# Patient Record
Sex: Male | Born: 1970 | Race: White | Hispanic: No | Smoking: Current every day smoker
Health system: Southern US, Community
[De-identification: ages and names within clinical notes are randomized; demographics above are authoritative.]

## PROBLEM LIST (undated history)

## (undated) DIAGNOSIS — E785 Hyperlipidemia, unspecified: Secondary | ICD-10-CM

## (undated) DIAGNOSIS — F4310 Post-traumatic stress disorder, unspecified: Secondary | ICD-10-CM

## (undated) DIAGNOSIS — W3400XA Accidental discharge from unspecified firearms or gun, initial encounter: Secondary | ICD-10-CM

## (undated) DIAGNOSIS — K8020 Calculus of gallbladder without cholecystitis without obstruction: Secondary | ICD-10-CM

## (undated) DIAGNOSIS — F319 Bipolar disorder, unspecified: Secondary | ICD-10-CM

## (undated) DIAGNOSIS — S71139A Puncture wound without foreign body, unspecified thigh, initial encounter: Secondary | ICD-10-CM

## (undated) HISTORY — PX: FRACTURE SURGERY: SHX138

---

## 2008-03-12 ENCOUNTER — Emergency Department: Payer: Self-pay | Admitting: Emergency Medicine

## 2008-04-27 ENCOUNTER — Inpatient Hospital Stay: Payer: Self-pay | Admitting: Unknown Physician Specialty

## 2008-06-19 ENCOUNTER — Emergency Department: Payer: Self-pay | Admitting: Internal Medicine

## 2008-06-20 ENCOUNTER — Emergency Department: Payer: Self-pay | Admitting: Emergency Medicine

## 2009-01-01 ENCOUNTER — Inpatient Hospital Stay: Payer: Self-pay | Admitting: Psychiatry

## 2009-01-12 ENCOUNTER — Inpatient Hospital Stay: Payer: Self-pay | Admitting: Psychiatry

## 2009-01-29 ENCOUNTER — Emergency Department: Payer: Self-pay | Admitting: Emergency Medicine

## 2009-03-29 ENCOUNTER — Emergency Department: Payer: Self-pay | Admitting: Emergency Medicine

## 2009-05-04 ENCOUNTER — Emergency Department: Payer: Self-pay | Admitting: Emergency Medicine

## 2009-06-14 ENCOUNTER — Emergency Department: Payer: Self-pay | Admitting: Unknown Physician Specialty

## 2009-08-12 ENCOUNTER — Emergency Department: Payer: Self-pay

## 2010-02-05 IMAGING — CR NASAL BONES - 3+ VIEW
1 series · 3 of 3 positions shown · non-contrast
Comparison: none

REASON FOR EXAM: trauma
COMMENTS:

PROCEDURE:     DXR - DXR NASAL BONES  - January 29, 2009  [DATE]
RESULT:     There is a minimally displaced, comminuted fracture of the nasal
bridge. The paranasal sinuses are clear.

[Series 1: view not recorded · 0.17mm/px · 3 of 3 slices shown]
[im 1/3]
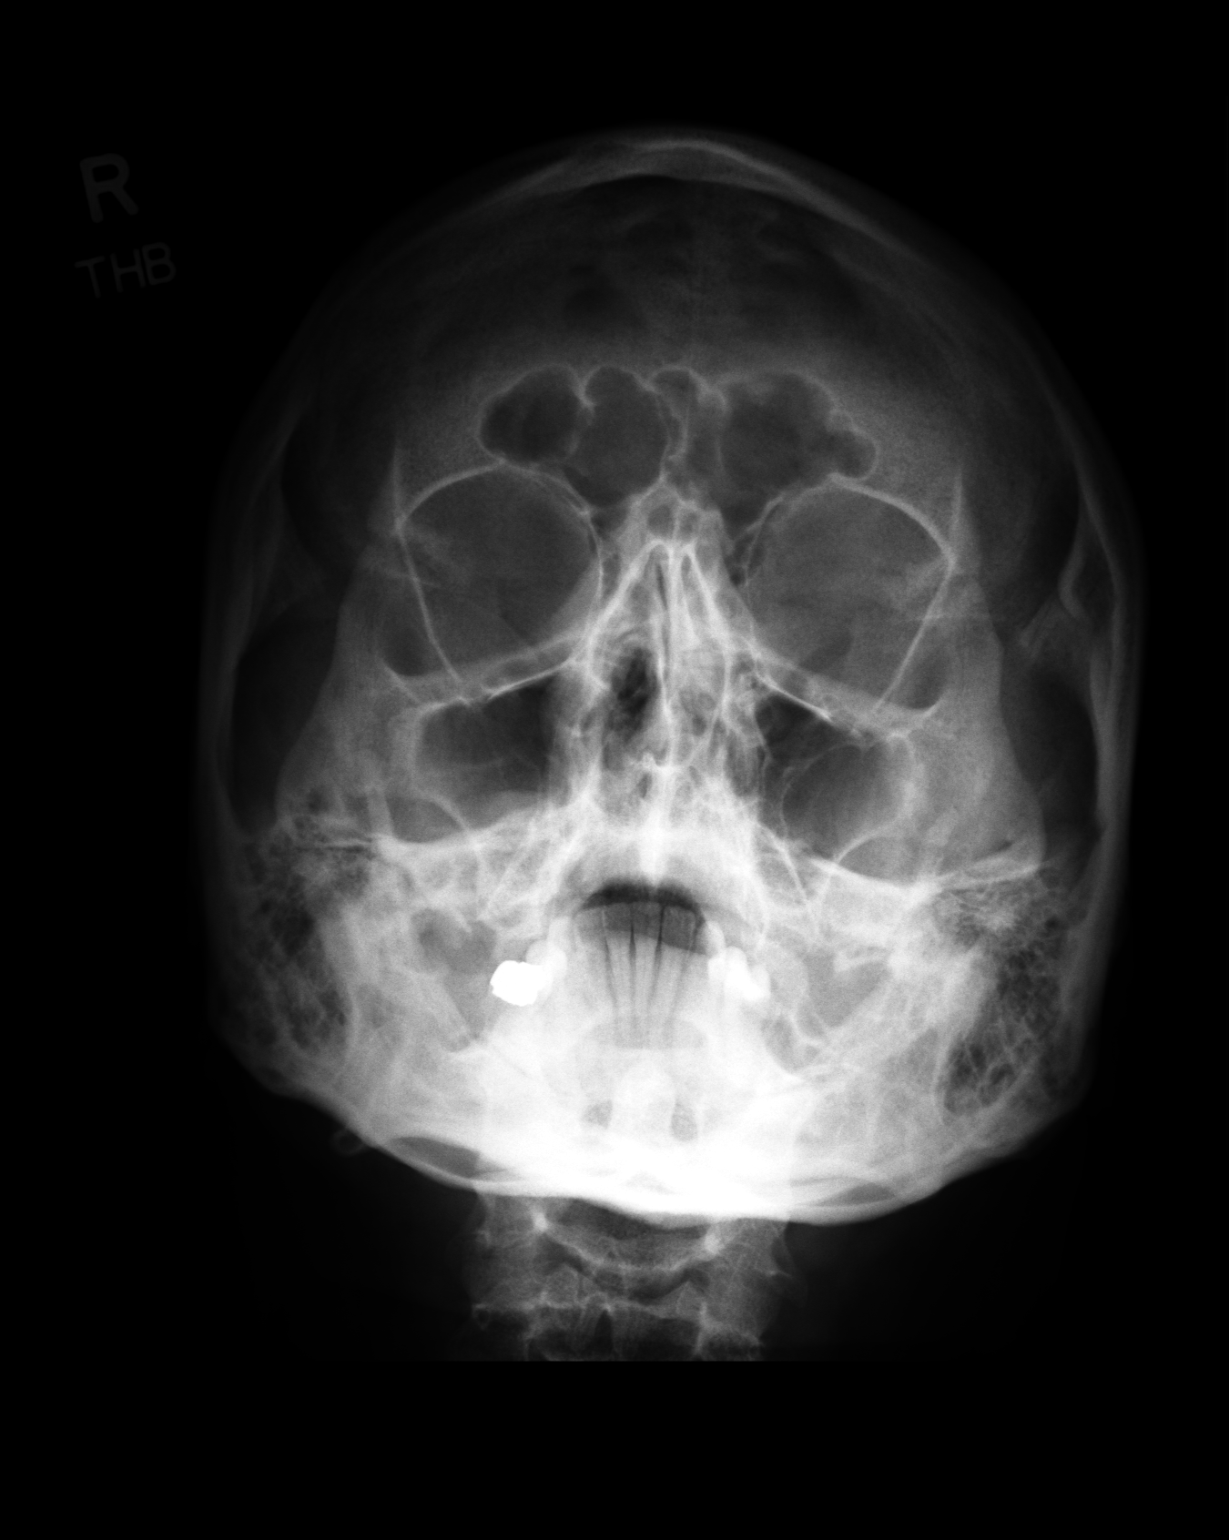
[im 2/3]
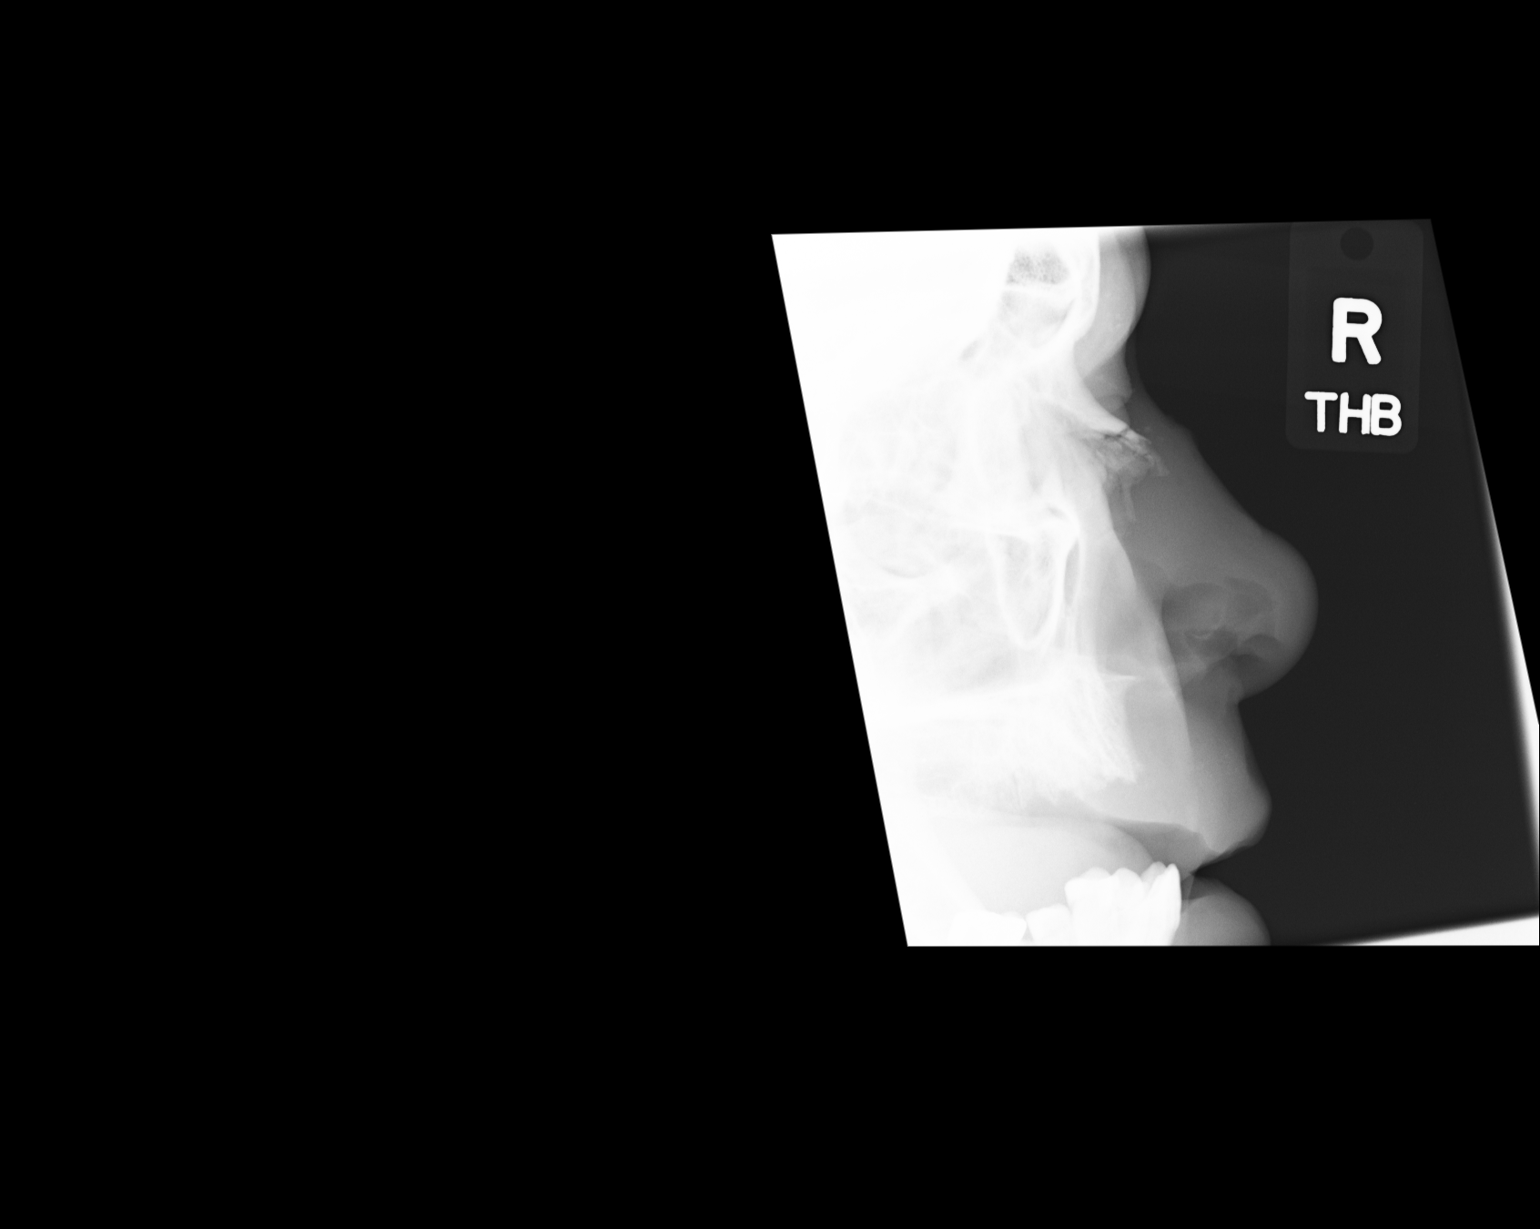
[im 3/3]
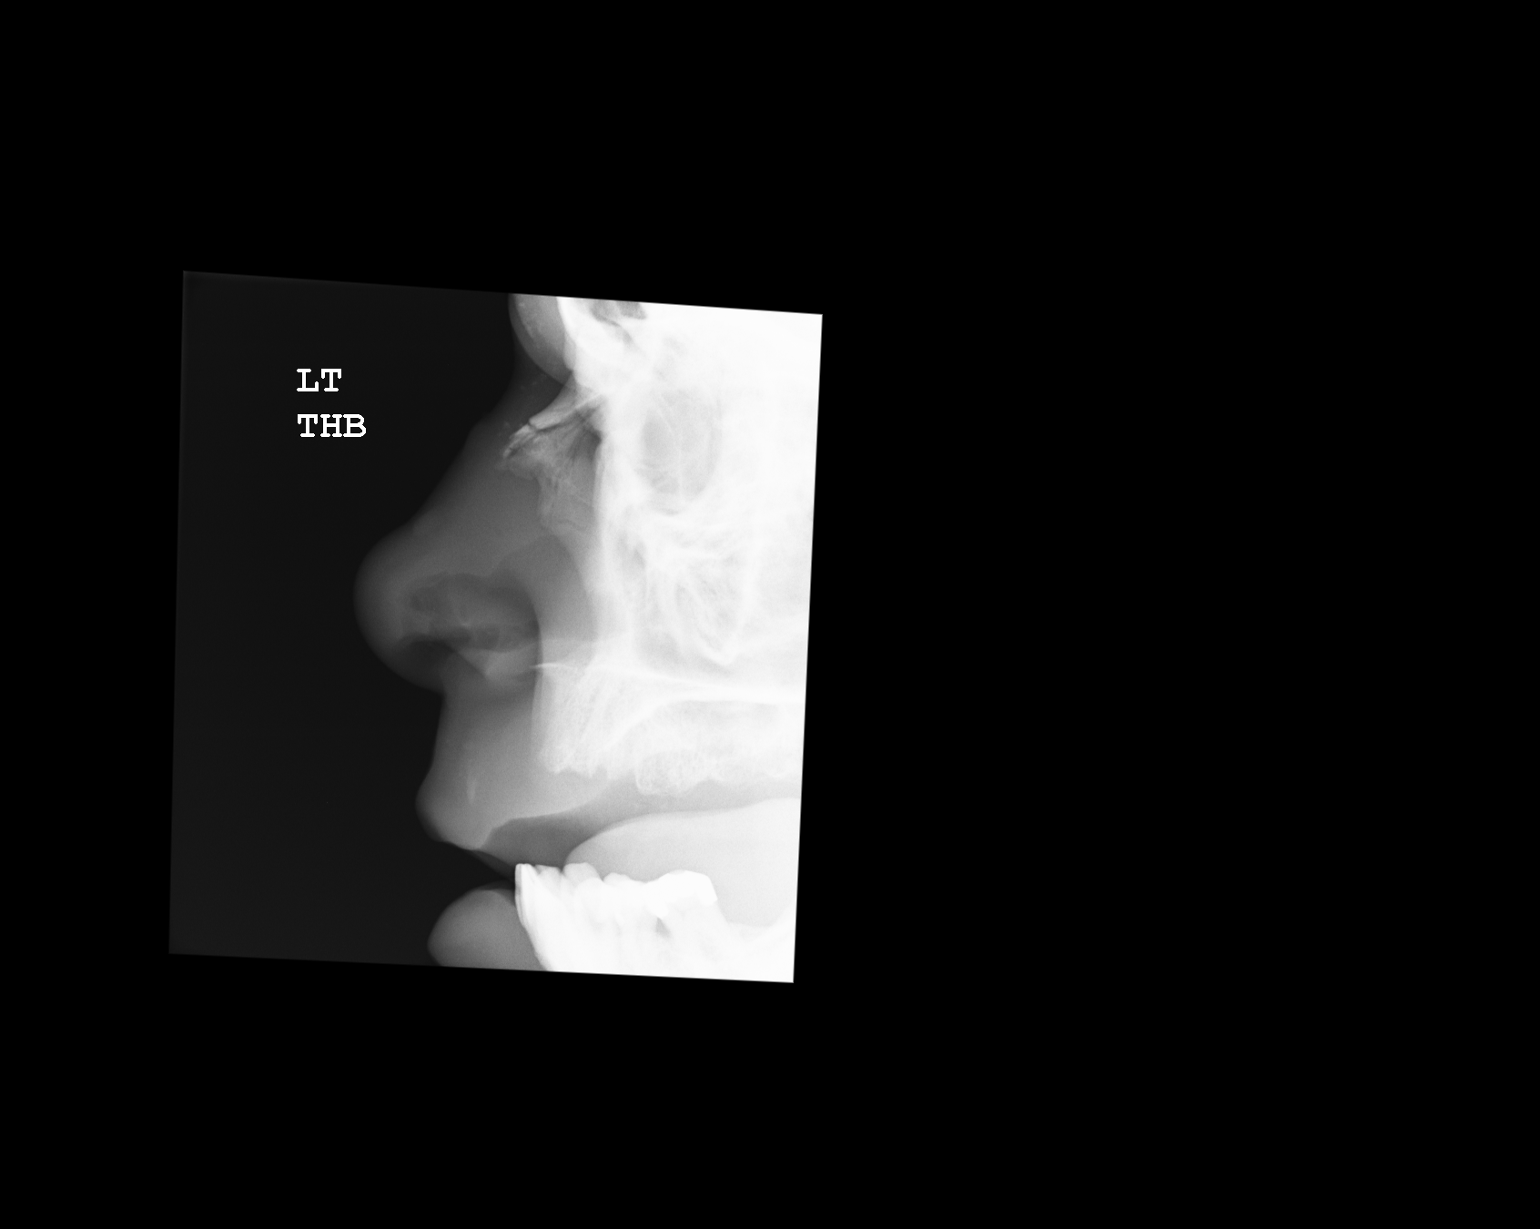

[3 of 3 positions shown; findings below may reference images not displayed]

IMPRESSION: 1. Fracture of the nasal bridge, minimally displaced.
2. The paranasal sinuses are clear.

## 2010-06-15 ENCOUNTER — Emergency Department: Payer: Self-pay | Admitting: Emergency Medicine

## 2010-06-23 ENCOUNTER — Emergency Department: Payer: Self-pay | Admitting: Emergency Medicine

## 2010-10-22 ENCOUNTER — Emergency Department (HOSPITAL_COMMUNITY)
Admission: EM | Admit: 2010-10-22 | Discharge: 2010-10-23 | Payer: Self-pay | Source: Home / Self Care | Admitting: Emergency Medicine

## 2010-12-20 ENCOUNTER — Emergency Department: Payer: Self-pay | Admitting: Emergency Medicine

## 2011-01-02 LAB — RAPID URINE DRUG SCREEN, HOSP PERFORMED
Amphetamines: POSITIVE — AB
Barbiturates: NOT DETECTED
Benzodiazepines: NOT DETECTED
Cocaine: NOT DETECTED
Opiates: NOT DETECTED

## 2011-01-02 LAB — CBC
MCH: 31.4 pg (ref 26.0–34.0)
MCHC: 36.9 g/dL — ABNORMAL HIGH (ref 30.0–36.0)
MCV: 85.2 fL (ref 78.0–100.0)
Platelets: 203 10*3/uL (ref 150–400)
RBC: 5.6 MIL/uL (ref 4.22–5.81)

## 2011-01-02 LAB — URINALYSIS, ROUTINE W REFLEX MICROSCOPIC
Glucose, UA: NEGATIVE mg/dL
Hgb urine dipstick: NEGATIVE
Specific Gravity, Urine: 1.025 (ref 1.005–1.030)
pH: 6.5 (ref 5.0–8.0)

## 2011-01-02 LAB — BASIC METABOLIC PANEL
CO2: 25 mEq/L (ref 19–32)
Calcium: 9.1 mg/dL (ref 8.4–10.5)
Chloride: 103 mEq/L (ref 96–112)
Creatinine, Ser: 0.78 mg/dL (ref 0.4–1.5)
Glucose, Bld: 94 mg/dL (ref 70–99)

## 2011-01-02 LAB — DIFFERENTIAL
Basophils Relative: 0 % (ref 0–1)
Eosinophils Absolute: 0.4 10*3/uL (ref 0.0–0.7)
Eosinophils Relative: 3 % (ref 0–5)
Lymphs Abs: 2.5 10*3/uL (ref 0.7–4.0)
Monocytes Absolute: 0.8 10*3/uL (ref 0.1–1.0)
Neutrophils Relative %: 69 % (ref 43–77)

## 2011-01-02 LAB — VALPROIC ACID LEVEL: Valproic Acid Lvl: 66 ug/mL (ref 50.0–100.0)

## 2015-04-14 ENCOUNTER — Emergency Department
Admission: EM | Admit: 2015-04-14 | Discharge: 2015-04-15 | Disposition: A | Discharge status: Other Acute Inpatient Hospital | Payer: Self-pay | Attending: Emergency Medicine | Admitting: Emergency Medicine

## 2015-04-14 DIAGNOSIS — F209 Schizophrenia, unspecified: Secondary | ICD-10-CM | POA: Insufficient documentation

## 2015-04-14 DIAGNOSIS — Z720 Tobacco use: Secondary | ICD-10-CM | POA: Insufficient documentation

## 2015-04-14 DIAGNOSIS — F1410 Cocaine abuse, uncomplicated: Secondary | ICD-10-CM

## 2015-04-14 DIAGNOSIS — Z880 Allergy status to penicillin: Secondary | ICD-10-CM | POA: Insufficient documentation

## 2015-04-14 DIAGNOSIS — F319 Bipolar disorder, unspecified: Secondary | ICD-10-CM

## 2015-04-14 DIAGNOSIS — R440 Auditory hallucinations: Secondary | ICD-10-CM

## 2015-04-14 HISTORY — DX: Hyperlipidemia, unspecified: E78.5

## 2015-04-14 HISTORY — DX: Calculus of gallbladder without cholecystitis without obstruction: K80.20

## 2015-04-14 LAB — CBC
HEMATOCRIT: 48.1 % (ref 40.0–52.0)
Hemoglobin: 16.4 g/dL (ref 13.0–18.0)
MCH: 29.1 pg (ref 26.0–34.0)
MCHC: 34 g/dL (ref 32.0–36.0)
MCV: 85.7 fL (ref 80.0–100.0)
PLATELETS: 273 10*3/uL (ref 150–440)
RBC: 5.61 MIL/uL (ref 4.40–5.90)
RDW: 12.7 % (ref 11.5–14.5)
WBC: 12.2 10*3/uL — AB (ref 3.8–10.6)

## 2015-04-14 LAB — COMPREHENSIVE METABOLIC PANEL
ALK PHOS: 57 U/L (ref 38–126)
ALT: 45 U/L (ref 17–63)
AST: 38 U/L (ref 15–41)
Albumin: 5 g/dL (ref 3.5–5.0)
Anion gap: 11 (ref 5–15)
BUN: 13 mg/dL (ref 6–20)
CALCIUM: 9 mg/dL (ref 8.9–10.3)
CO2: 24 mmol/L (ref 22–32)
Chloride: 102 mmol/L (ref 101–111)
Creatinine, Ser: 0.91 mg/dL (ref 0.61–1.24)
GFR calc Af Amer: >60 mL/min (ref >60)
GFR calc non Af Amer: >60 mL/min (ref >60)
Glucose, Bld: 143 mg/dL — ABNORMAL HIGH (ref 65–99)
POTASSIUM: 3.6 mmol/L (ref 3.5–5.1)
Sodium: 137 mmol/L (ref 135–145)
TOTAL PROTEIN: 8.3 g/dL — AB (ref 6.5–8.1)
Total Bilirubin: 0.7 mg/dL (ref 0.3–1.2)

## 2015-04-14 LAB — SALICYLATE LEVEL

## 2015-04-14 LAB — ACETAMINOPHEN LEVEL

## 2015-04-14 LAB — ETHANOL

## 2015-04-14 MED ORDER — ZIPRASIDONE HCL 20 MG PO CAPS
40.0000 mg | ORAL_CAPSULE | Freq: Two times a day (BID) | ORAL | Status: DC
  Administered 2015-04-15: 40 mg via ORAL
  Filled 2015-04-14: qty 2

## 2015-04-14 NOTE — ED Notes (Signed)

## 2015-04-14 NOTE — ED Notes (Signed)
ENVIRONMENTAL ASSESSMENT Potentially harmful objects out of patient reach: Yes.   Personal belongings secured: Yes.   Patient dressed in hospital provided attire only: Yes.   Plastic bags out of patient reach: Yes.   Patient care equipment (cords, cables, call bells, lines, and drains) shortened, removed, or accounted for: Yes.   Equipment and supplies removed from bottom of stretcher: N/A Potentially toxic materials out of patient reach: Yes.   Sharps container removed or out of patient reach: N/A 

## 2015-04-14 NOTE — ED Notes (Signed)
Are from [facility].  Pt sleeping in bed.

## 2015-04-14 NOTE — ED Notes (Signed)

## 2015-04-14 NOTE — ED Notes (Signed)
Pt eating lunch at this time

## 2015-04-14 NOTE — ED Notes (Signed)
Report received from [facility] RN pt moved to ED BHU 3. Patient care assumed. Patient/RN introduction complete. Will continue to monitor.

## 2015-04-14 NOTE — ED Notes (Addendum)
BEHAVIORAL HEALTH ROUNDING Patient sleeping: Yes Patient alert and oriented: yes Behavior appropriate: Yes.  ; Nutrition and fluids offered: Yes  Toileting and hygiene offered: Yes  Sitter present: not applicable Law enforcement present: Yes

## 2015-04-14 NOTE — ED Notes (Signed)
Pt. transfered to BHU without incident after report from. Placed in room and oriented to unit. Pt. informed that for their safety all care areas are designed for safety and monitored by security cameras at all times; and visiting hours explained to patient. Patient verbalizes understanding, and verbal contract for safety obtained.   

## 2015-04-14 NOTE — ED Notes (Signed)
BEHAVIORAL HEALTH ROUNDING Patient sleeping: No. Patient alert and oriented: yes Behavior appropriate: Yes.  ; If no, describe:  Nutrition and fluids offered: Yes  Toileting and hygiene offered: Yes  Sitter present: yes Law enforcement present: Yes  

## 2015-04-14 NOTE — ED Notes (Signed)
BEHAVIORAL HEALTH ROUNDING Patient sleeping: no Patient alert and oriented: yes Behavior appropriate: Yes.  ; Nutrition and fluids offered: Yes  Toileting and hygiene offered: Yes  Sitter present: not applicable Law enforcement present: Yes   

## 2015-04-14 NOTE — ED Notes (Signed)
ED BHU PLACEMENT JUSTIFICATION Is the patient under IVC or is there intent for IVC: No. Is the patient medically cleared: Yes.   Is there vacancy in the ED BHU: Yes.   Is the population mix appropriate for patient: Yes.   Is the patient awaiting placement in inpatient or outpatient setting: Yes.   Has the patient had a psychiatric consult: No. Survey of unit performed for contraband, proper placement and condition of furniture, tampering with fixtures in bathroom, shower, and each patient room: Yes.  ; Findings:  APPEARANCE/BEHAVIOR calm, cooperative and adequate rapport can be established NEURO ASSESSMENT Orientation: time, place and person Hallucinations: Yes.  Auditory Hallucinations Speech: Normal Gait: normal RESPIRATORY ASSESSMENT Normal expansion.  Clear to auscultation.  No rales, rhonchi, or wheezing. CARDIOVASCULAR ASSESSMENT regular rate and rhythm, S1, S2 normal, no murmur, click, rub or gallop GASTROINTESTINAL ASSESSMENT soft, nontender, BS WNL, no r/g EXTREMITIES normal strength, tone, and muscle mass PLAN OF CARE Provide calm/safe environment. Vital signs assessed twice daily. ED BHU Assessment once each 12-hour shift. Collaborate with intake RN daily or as condition indicates. Assure the ED provider has rounded once each shift. Provide and encourage hygiene. Provide redirection as needed. Assess for escalating behavior; address immediately and inform ED provider.  Assess family dynamic and appropriateness for visitation as needed: Yes.  ; If necessary, describe findings:  Educate the patient/family about BHU procedures/visitation: Yes.  ; If necessary, describe findings:

## 2015-04-14 NOTE — ED Notes (Signed)
Pt sleeping. 

## 2015-04-14 NOTE — ED Notes (Signed)
[place], MD at bedside at this time  

## 2015-04-14 NOTE — ED Notes (Signed)
Breakfast tray given. °

## 2015-04-14 NOTE — ED Notes (Signed)
Pt reports auditory hallucinations and SI. Pt states he normally takes Geodon and Cogentin, but has not been complaint or had access to those medications in over a year. Pt states he's here for help obtaining his medications, but then goes on the state, "I'm suicidal..I'm schizophrenic.Marland KitchenMarland KitchenI keep hearing voices telling me to kill myself". Pt denies any suicidal intention or concrete plan.

## 2015-04-14 NOTE — Consult Note (Signed)
[facility] Consult   Reason for Consult:  Consult for this 44 year old man with a history of bipolar disorder and substance abuse Referring Physician:  [place] Patient Identification: Wayne Santiago MRN:  [id number] Principal Diagnosis: Bipolar 1 disorder, depressed Diagnosis:   Patient Active Problem List   Diagnosis Date Noted  . Bipolar 1 disorder, depressed [F31.9] 04/14/2015  . Cocaine abuse [F14.10] 04/14/2015    Total Time spent with patient: 1 hour  Subjective:   [name] is a 44 y.o. male patient admitted with "I'm suicidal and hearing voices".  HPI:  Patient presented to the emergency room stating that he was hearing voices and was suicidal. He had trouble characterizing the voices saying that they were just voices. He could not characterize how long they have been going on. He says that he is suicidal but has no specific method or intent in mind. Says that he has been feeling suicidal for a few days. Mood is been down. Sleeping poorly. He's been off his medicine for an indefinite period of time. Recently was at the homeless shelter for the last couple days after getting thrown out of the living situation he had been in. Has relapsed into abuse of crack cocaine heavily over the last few days. Has not made any attempts to harm himself. Denies homicidal ideation.  Past psychiatric history as had at least 2 prior psychiatric hospitalizations here and multiple others at other facilities. He said he's been admitted to the hospital about 20 times in his life. Revisit admissions had very similar presentations and also usually involve cocaine abuse. He has been treated with several different anti-psychotics but remembers Geodon is the most helpful. Says he had 1 prior suicide attempt in 1998.  Social history: Currently no place to live. Was at the shelter for the last couple days. His been a long time since he had a stable place to live. Closest living relative is  his sister.  Medical history: History of gallstones. No acute symptoms. No other significant known ongoing medical problems.  Family history: Denies any known family history  Current medications none  Substance abuse history: Long history of intermittent cocaine abuse. Occasional alcohol use but says he hasn't been drinking in a while. HPI Elements:   Quality:  Depression and suicidal ideation so auditory hallucinations. Severity:  Severe potentially life threatening. Timing:  Bad over the last 2-3 days. Duration:  Chronic recurrent problem. Context:  Relapse into cocaine use lack of follow-up with medicine.  Past Medical History:  Past Medical History  Diagnosis Date  . Hyperlipemia   . Gall stones    No past surgical history on file. Family History: No family history on file. Social History:  History  Alcohol Use No     History  Drug Use  . Yes  . Special: Cocaine    History   Social History  . Marital Status: Legally Separated    Spouse Name: N/A  . Number of Children: N/A  . Years of Education: N/A   Social History Main Topics  . Smoking status: Current Every Day Smoker -- 2.00 packs/day for 20 years    Types: Cigarettes  . Smokeless tobacco: Not on file  . Alcohol Use: No  . Drug Use: Yes    Special: Cocaine  . Sexual Activity: Not on file   Other Topics Concern  . None   Social History Narrative  . None   Additional Social History:    History of alcohol / drug use?:  Yes Negative Consequences of Use: Financial Name of Substance 1: Cocaine 1 - Age of First Use: 43 1 - Amount (size/oz): Unsure 1 - Frequency: Randomly 1 - Last Use / Amount: 04/13/2015                   Allergies:   Allergies  Allergen Reactions  . Penicillins Anaphylaxis    Labs:  Results for orders placed or performed during the hospital encounter of 04/14/15 (from the past 48 hour(s))  Acetaminophen level     Status: Abnormal   Collection Time: 04/14/15  2:43 AM   Result Value Ref Range   Acetaminophen (Tylenol), Serum <10 (L) 10 - 30 ug/mL    Comment:        THERAPEUTIC CONCENTRATIONS VARY SIGNIFICANTLY. A RANGE OF 10-30 ug/mL MAY BE AN EFFECTIVE CONCENTRATION FOR MANY PATIENTS. HOWEVER, SOME ARE BEST TREATED AT CONCENTRATIONS OUTSIDE THIS RANGE. ACETAMINOPHEN CONCENTRATIONS >150 ug/mL AT 4 HOURS AFTER INGESTION AND >50 ug/mL AT 12 HOURS AFTER INGESTION ARE OFTEN ASSOCIATED WITH TOXIC REACTIONS.   CBC     Status: Abnormal   Collection Time: 04/14/15  2:43 AM  Result Value Ref Range   WBC 12.2 (H) 3.8 - 10.6 K/uL   RBC 5.61 4.40 - 5.90 MIL/uL   Hemoglobin 16.4 13.0 - 18.0 g/dL   HCT 48.1 40.0 - 52.0 %   MCV 85.7 80.0 - 100.0 fL   MCH 29.1 26.0 - 34.0 pg   MCHC 34.0 32.0 - 36.0 g/dL   RDW 12.7 11.5 - 14.5 %   Platelets 273 150 - 440 K/uL  Comprehensive metabolic panel     Status: Abnormal   Collection Time: 04/14/15  2:43 AM  Result Value Ref Range   Sodium 137 135 - 145 mmol/L   Potassium 3.6 3.5 - 5.1 mmol/L   Chloride 102 101 - 111 mmol/L   CO2 24 22 - 32 mmol/L   Glucose, Bld 143 (H) 65 - 99 mg/dL   BUN 13 6 - 20 mg/dL   Creatinine, Ser 0.91 0.61 - 1.24 mg/dL   Calcium 9.0 8.9 - 10.3 mg/dL   Total Protein 8.3 (H) 6.5 - 8.1 g/dL   Albumin 5.0 3.5 - 5.0 g/dL   AST 38 15 - 41 U/L   ALT 45 17 - 63 U/L   Alkaline Phosphatase 57 38 - 126 U/L   Total Bilirubin 0.7 0.3 - 1.2 mg/dL   GFR calc non Af Amer >60 >60 mL/min   GFR calc Af Amer >60 >60 mL/min    Comment: (NOTE) The eGFR has been calculated using the CKD EPI equation. This calculation has not been validated in all clinical situations. eGFR's persistently <60 mL/min signify possible Chronic Kidney Disease.    Anion gap 11 5 - 15  Ethanol (ETOH)     Status: None   Collection Time: 04/14/15  2:43 AM  Result Value Ref Range   Alcohol, Ethyl (B) <5 <5 mg/dL    Comment:        LOWEST DETECTABLE LIMIT FOR SERUM ALCOHOL IS 5 mg/dL FOR MEDICAL PURPOSES ONLY    Salicylate level     Status: None   Collection Time: 04/14/15  2:43 AM  Result Value Ref Range   Salicylate Lvl <5.4 2.8 - 30.0 mg/dL    Vitals: Blood pressure 94/46, pulse 65, temperature 97.7 F (36.5 C), temperature source Oral, resp. rate 18, height 6' 2"  (1.88 m), weight 108.863 kg (240 lb), SpO2 96 %.  Risk to Self: Suicidal Ideation: Yes-Currently Present Suicidal Intent: No Is patient at risk for suicide?: Yes Suicidal Plan?: No Access to Means: No What has been your use of drugs/alcohol within the last 12 months?: Cocaine usage How many times?: 1 Other Self Harm Risks: None Triggers for Past Attempts: Unknown Intentional Self Injurious Behavior: None Risk to Others: Homicidal Ideation: No Thoughts of Harm to Others: No Current Homicidal Intent: No Current Homicidal Plan: No Access to Homicidal Means: No Identified Victim: None History of harm to others?: No Assessment of Violence: None Noted Violent Behavior Description: None reported Does patient have access to weapons?: No Criminal Charges Pending?: No Does patient have a court date: No Prior Inpatient Therapy: Prior Inpatient Therapy: No Prior Outpatient Therapy: Prior Outpatient Therapy: No Does patient have an ACCT team?: No Does patient have Intensive In-House Services?  : No Does patient have [facility] services? : No Does patient have [facility] services?: No  No current facility-administered medications for this encounter.   No current outpatient prescriptions on file.    Musculoskeletal: Strength & Muscle Tone: within normal limits Gait & Station: normal Patient leans: N/A  Psychiatric Specialty Exam: Physical Exam  Constitutional: He appears well-developed and well-nourished.  HENT:  Head: Normocephalic and atraumatic.  Eyes: Conjunctivae are normal. Pupils are equal, round, and reactive to light.  Neck: Normal range of motion.  Cardiovascular: Normal heart sounds.   Respiratory: Effort normal.   GI: Soft.  Musculoskeletal: Normal range of motion.  Neurological: He is alert.  Skin: Skin is warm and dry.  Psychiatric: His affect is blunt. His speech is delayed. He is slowed. Thought content is delusional. Cognition and memory are impaired. He expresses impulsivity. He expresses suicidal ideation.    Review of Systems  Constitutional: Negative.   HENT: Negative.   Eyes: Negative.   Respiratory: Negative.   Cardiovascular: Negative.   Gastrointestinal: Negative.   Musculoskeletal: Negative.   Skin: Negative.   Neurological: Negative.   Psychiatric/Behavioral: Positive for depression, suicidal ideas, hallucinations and substance abuse. The patient is nervous/anxious and has insomnia.     Blood pressure 94/46, pulse 65, temperature 97.7 F (36.5 C), temperature source Oral, resp. rate 18, height 6' 2"  (1.88 m), weight 108.863 kg (240 lb), SpO2 96 %.Body mass index is 30.8 kg/(m^2).  General Appearance: Disheveled  Eye Contact::  Poor  Speech:  Slow and Slurred  Volume:  Decreased  Mood:  Depressed  Affect:  Depressed  Thought Process:  Coherent  Orientation:  Full (Time, Place, and Person)  Thought Content:  Hallucinations: Auditory  Suicidal Thoughts:  Yes.  without intent/plan  Homicidal Thoughts:  No  Memory:  Immediate;   Good Recent;   Fair Remote;   Fair  Judgement:  Impaired  Insight:  Lacking  Psychomotor Activity:  Decreased  Concentration:  Poor  Recall:  [employer] of Knowledge:Fair  Language: Fair  Akathisia:  No  Handed:  Right  AIMS (if indicated):     Assets:  Communication Skills Physical Health  ADL's:  Intact  Cognition: WNL  Sleep:      Medical Decision Making: Review of Psycho-Social Stressors (1), Review or order clinical lab tests (1), Established Problem, Worsening (2), Review of Medication Regimen & Side Effects (2) and Review of New Medication or Change in Dosage (2)  Treatment Plan Summary: Medication management and Plan Patient will  be put back on his Geodon 40 mg a day. He continues to voice suicidal ideation. I reviewed his old  chart and it looks like he's had a diagnosis of bipolar disorder in the past and seems to of benefited from treatment. Once we have a bed available downstairs we will admit him to the hospital and can stabilize and then. He is requesting referral to a better living situation like a group home and the inpatient team can look into that.  Plan:  Recommend psychiatric Inpatient admission when medically cleared. Disposition: Admit to psychiatry continue IVC  Alethia Berthold 04/14/2015 7:17 PM

## 2015-04-14 NOTE — ED Notes (Signed)
ED BHU PLACEMENT JUSTIFICATION Is the patient under IVC or is there intent for IVC: No. Is the patient medically cleared: Yes.   Is there vacancy in the ED BHU: Yes.   Is the population mix appropriate for patient: Yes.   Is the patient awaiting placement in inpatient or outpatient setting: Yes.   Has the patient had a psychiatric consult: Yes.   Survey of unit performed for contraband, proper placement and condition of furniture, tampering with fixtures in bathroom, shower, and each patient room: Yes.  ; Findings:  APPEARANCE/BEHAVIOR calm, cooperative and adequate rapport can be established NEURO ASSESSMENT Orientation: time Hallucinations: No.None noted (Hallucinations) Speech: Normal Gait: normal RESPIRATORY ASSESSMENT Normal expansion.  Clear to auscultation.  No rales, rhonchi, or wheezing. CARDIOVASCULAR ASSESSMENT regular rate and rhythm, S1, S2 normal, no murmur, click, rub or gallop GASTROINTESTINAL ASSESSMENT soft, nontender, BS WNL, no r/g EXTREMITIES normal strength, tone, and muscle mass PLAN OF CARE Provide calm/safe environment. Vital signs assessed twice daily. ED BHU Assessment once each 12-hour shift. Collaborate with intake RN daily or as condition indicates. Assure the ED provider has rounded once each shift. Provide and encourage hygiene. Provide redirection as needed. Assess for escalating behavior; address immediately and inform ED provider.  Assess family dynamic and appropriateness for visitation as needed: Yes.  ; If necessary, describe findings:  Educate the patient/family about BHU procedures/visitation: Yes.  ; If necessary, describe findings:

## 2015-04-14 NOTE — ED Notes (Signed)
Patient assigned to appropriate care area in room 20. Patient oriented to unit/care area: Informed that, for their safety, care areas are designed for safety and monitored by security cameras at all times; and visiting hours explained to patient. Patient verbalizes understanding, and verbal contract for safety obtained.

## 2015-04-14 NOTE — ED Notes (Signed)
Pt presents to ED via [facility] from [facility] [place] where the pt requested to be brought to the ED for evaluation for auditory hallucinations. Per EMS, pt has a previous medical h/x of the same, and reports the pt has not been complaint wit taking the medications he is supposed to take to control his symptoms. Pt is ambulatory to the ED with EMS,. A&O, and cooperative.

## 2015-04-14 NOTE — ED Notes (Signed)
Pt laying in bed without any distress noted will continue to monitor.

## 2015-04-14 NOTE — ED Notes (Signed)
Pt in day room requesting Chocolate milk.

## 2015-04-14 NOTE — ED Notes (Signed)
BEHAVIORAL HEALTH ROUNDING Patient sleeping: No. Patient alert and oriented: yes Behavior appropriate: Yes.  ;  Nutrition and fluids offered: Yes  Toileting and hygiene offered: Yes  Sitter present: yes Law enforcement present: Yes  

## 2015-04-14 NOTE — ED Notes (Signed)
BEHAVIORAL HEALTH ROUNDING Patient sleeping: Yes.   Patient alert and oriented: yes Behavior appropriate: Yes.  ; If no, describe:  Nutrition and fluids offered: Yes  Toileting and hygiene offered: Yes  Sitter present: yes Law enforcement present: Yes  

## 2015-04-14 NOTE — ED Notes (Addendum)
BEHAVIORAL HEALTH ROUNDING Patient sleeping: No. Patient alert and oriented: yes Behavior appropriate: Yes.  ;  Nutrition and fluids offered: Yes  Toileting and hygiene offered: Yes  Sitter present: not applicable Law enforcement present: Yes   

## 2015-04-14 NOTE — ED Provider Notes (Signed)
[facility] Emergency Department Provider Note  ____________________________________________  Time seen: Approximately 3:58 AM  I have reviewed the triage vital signs and the nursing notes.   HISTORY  Chief Complaint Hallucinations    HPI Wayne Santiago is a 44 y.o. male who presents voluntary for auditory hallucinations and vague suicidal thoughts. Patient isoff his medicines (Geodon and Cogentin) and states he is hearing voices telling him to kill himself. Patient denies any plan for suicide. Patient denies homicidal ideation.   Past Medical History  Diagnosis Date  . Hyperlipemia   . Gall stones     There are no active problems to display for this patient.   No past surgical history on file.  No current outpatient prescriptions on file.  Allergies Penicillins  No family history on file.  Social History History  Substance Use Topics  . Smoking status: Current Every Day Smoker -- 2.00 packs/day for 20 years    Types: Cigarettes  . Smokeless tobacco: Not on file  . Alcohol Use: No    Review of Systems Constitutional: No fever/chills Eyes: No visual changes. ENT: No sore throat. Cardiovascular: Denies chest pain. Respiratory: Denies shortness of breath. Gastrointestinal: No abdominal pain.  No nausea, no vomiting.  No diarrhea.  No constipation. Genitourinary: Negative for dysuria. Musculoskeletal: Negative for back pain. Skin: Negative for rash. Neurological: Negative for headaches, focal weakness or numbness. Psychiatric: Positive for auditory hallucinations and suicidal ideations.  10-point ROS otherwise negative.  ____________________________________________   PHYSICAL EXAM:  VITAL SIGNS: ED Triage Vitals  Enc Vitals Group     BP 04/14/15 0232 128/84 mmHg     Pulse Rate 04/14/15 0232 85     Resp 04/14/15 0232 16     Temp 04/14/15 0232 98.3 F (36.8 C)     Temp Source 04/14/15 0232 Oral     SpO2 04/14/15 0232 95 %      Weight 04/14/15 0232 240 lb (108.863 kg)     Height 04/14/15 0232 6\' 2"  (1.88 m)     Head Cir --      Peak Flow --      Pain Score --      Pain Loc --      Pain Edu? --      Excl. in GC? --     Constitutional: Alert and oriented. Well appearing and in no acute distress. Eyes: Conjunctivae are normal. PERRL. EOMI. Head: Atraumatic. Nose: No congestion/rhinnorhea. Mouth/Throat: Mucous membranes are moist.  Oropharynx non-erythematous. Neck: No stridor.   Cardiovascular: Normal rate, regular rhythm. Grossly normal heart sounds.  Good peripheral circulation. Respiratory: Normal respiratory effort.  No retractions. Lungs CTAB. Gastrointestinal: Soft and nontender. No distention. No abdominal bruits. No CVA tenderness. Musculoskeletal: No lower extremity tenderness nor edema.  No joint effusions. Neurologic:  Normal speech and language. No gross focal neurologic deficits are appreciated. Speech is normal. No gait instability. Skin:  Skin is warm, dry and intact. No rash noted. Psychiatric: Mood and affect are flat. Speech and behavior are normal.  ____________________________________________   LABS (all labs ordered are listed, but only abnormal results are displayed)  Labs Reviewed  ACETAMINOPHEN LEVEL - Abnormal; Notable for the following:    Acetaminophen (Tylenol), Serum <10 (*)    All other components within normal limits  CBC - Abnormal; Notable for the following:    WBC 12.2 (*)    All other components within normal limits  COMPREHENSIVE METABOLIC PANEL - Abnormal; Notable for the following:  Glucose, Bld 143 (*)    Total Protein 8.3 (*)    All other components within normal limits  ETHANOL  SALICYLATE LEVEL  URINE RAPID DRUG SCREEN, HOSP PERFORMED  URINE DRUG SCREEN, QUALITATIVE ([facility] ONLY)  URINALYSIS COMPLETEWITH MICROSCOPIC ([facility] ONLY)    ____________________________________________  EKG  None ____________________________________________  RADIOLOGY  None ____________________________________________   PROCEDURES  Procedure(s) performed: None  Critical Care performed: No  ____________________________________________   INITIAL IMPRESSION / ASSESSMENT AND PLAN / ED COURSE  Pertinent labs & imaging results that were available during my care of the patient were reviewed by me and considered in my medical decision making (see chart for details).  44 year old male with a history of schizophrenia who is off his medicines presents voluntarily for auditory hallucinations which are telling him to harm himself. Patient has no concrete plan for suicide and contracts for safety while in the emergency department. He will be moved to the [facility] pending psychiatry consult in the morning. ____________________________________________   FINAL CLINICAL IMPRESSION(S) / ED DIAGNOSES  Final diagnoses:  Schizophrenia, unspecified type  Auditory hallucinations      Irean Hong, MD 04/14/15 [phone]

## 2015-04-14 NOTE — ED Notes (Signed)
Pt laying in bed.  

## 2015-04-14 NOTE — ED Notes (Signed)
Pt in room sleeping at this time.

## 2015-04-14 NOTE — BH Assessment (Signed)
Assessment Note  Wayne Santiago is an 44 y.o. male. He reports to the ED by way of EMS.  He reports that he is homeless and is staying at [employer].  He reports having suicidal thoughts and hearing voices. He denied suicidal intent.  Wayne Santiago reports that he was on medication, but he ceased to take his medication 8 months ago.  He states that he is now hearing voices that are saying a lot of different things and are just talking.  He states that he uses cocaine. He reports being depressed. He states "I have nothing". He denied having homicidal ideation or intent. He reports a prior diagnosis of schizophrenia.  Axis I: Psychotic Disorder NOS Axis II: Deferred Axis III:  Past Medical History  Diagnosis Date   Hyperlipemia    Gall stones    Axis IV: economic problems, housing problems and other psychosocial or environmental problems Axis V: 31-40 impairment in reality testing  Past Medical History:  Past Medical History  Diagnosis Date   Hyperlipemia    Gall stones     No past surgical history on file.  Family History: No family history on file.  Social History:  reports that he has been smoking Cigarettes.  He has a 40 pack-year smoking history. He does not have any smokeless tobacco history on file. He reports that he uses illicit drugs (Cocaine). He reports that he does not drink alcohol.  Additional Social History:  Alcohol / Drug Use History of alcohol / drug use?: Yes Negative Consequences of Use: Financial Substance #1 Name of Substance 1: Cocaine 1 - Age of First Use: 43 1 - Amount (size/oz): Unsure 1 - Frequency: Randomly 1 - Last Use / Amount: 04/13/2015  CIWA: CIWA-Ar BP: 128/84 mmHg Pulse Rate: 85 COWS:    Allergies:  Allergies  Allergen Reactions   Penicillins Anaphylaxis    Home Medications:  (Not in a hospital admission)  OB/GYN Status:  No LMP for male patient.  General Assessment Data Location of Assessment: [facility] ED TTS  Assessment: In system Is this a Tele or Face-to-Face Assessment?: Face-to-Face Is this an Initial Assessment or a Re-assessment for this encounter?: Initial Assessment Marital status: Single Maiden name: n/a Is patient pregnant?: No Pregnancy Status: No Living Arrangements: Other (Comment) (Homeless, staying at [facility] [employer] [facility]) Can pt return to current living arrangement?: Yes Admission Status: Voluntary Is patient capable of signing voluntary admission?: Yes Referral Source: MD Insurance type: Medicare  Medical Screening Exam [facility] Walk-in ONLY) Medical Exam completed: Yes  Crisis Care Plan Living Arrangements: Other (Comment) (Homeless, staying at [employer] [facility] [employer]) Name of Psychiatrist: None Name of Therapist: None  Education Status Is patient currently in school?: No Current Grade: n/a Highest grade of school patient has completed: 12th Name of school: n/a Solicitor person: n/a  Risk to self with the past 6 months Suicidal Ideation: Yes-Currently Present Has patient been a risk to self within the past 6 months prior to admission? : No Suicidal Intent: No Has patient had any suicidal intent within the past 6 months prior to admission? : No Is patient at risk for suicide?: Yes Suicidal Plan?: No Has patient had any suicidal plan within the past 6 months prior to admission? : No Access to Means: No What has been your use of drugs/alcohol within the last 12 months?: Cocaine usage Previous Attempts/Gestures: Yes How many times?: 1 Other Self Harm Risks: None Triggers for Past Attempts: Unknown Intentional Self  Injurious Behavior: None Family Suicide History: Unknown Recent stressful life event(s):  (Homeless) Depression: Yes Depression Symptoms: Loss of interest in usual pleasures, Feeling worthless/self pity Substance abuse history and/or treatment for substance abuse?: Yes  Risk to Others within the past 6 months Homicidal  Ideation: No Does patient have any lifetime risk of violence toward others beyond the six months prior to admission? : No Thoughts of Harm to Others: No Current Homicidal Intent: No Current Homicidal Plan: No Access to Homicidal Means: No Identified Victim: None History of harm to others?: No Assessment of Violence: None Noted Violent Behavior Description: None reported Does patient have access to weapons?: No Criminal Charges Pending?: No Does patient have a court date: No Is patient on probation?: No  Psychosis Hallucinations: Auditory Delusions: None noted  Mental Status Report Appearance/Hygiene: In scrubs, Unremarkable Eye Contact: Poor Motor Activity: Unremarkable Speech: Unremarkable Level of Consciousness: Alert Mood: Sullen Affect: Irritable Anxiety Level: None Judgement: Unimpaired Orientation: Person, Place, Situation Obsessive Compulsive Thoughts/Behaviors: None  Cognitive Functioning Concentration: Poor Appetite: Good Sleep: No Change  ADLScreening [facility]) Patient's cognitive ability adequate to safely complete daily activities?: Yes Patient able to express need for assistance with ADLs?: Yes Independently performs ADLs?: Yes (appropriate for developmental age)  Prior Inpatient Therapy Prior Inpatient Therapy: No  Prior Outpatient Therapy Prior Outpatient Therapy: No Does patient have an ACCT team?: No Does patient have Intensive In-House Services?  : No Does patient have [facility] services? : No Does patient have P4CC services?: No  ADL Screening (condition at time of admission) Patient's cognitive ability adequate to safely complete daily activities?: Yes Patient able to express need for assistance with ADLs?: Yes Independently performs ADLs?: Yes (appropriate for developmental age)       Abuse/Neglect Assessment (Assessment to be complete while patient is alone) Physical Abuse: Denies Verbal Abuse: Denies Sexual Abuse:  Denies Exploitation of patient/patient's resources: Denies Self-Neglect: Denies Values / Beliefs Cultural Requests During Hospitalization: None Spiritual Requests During Hospitalization: None   Advance Directives (For Healthcare) Does patient have an advance directive?: No Would patient like information on creating an advanced directive?: No - patient declined information    Additional Information 1:1 In Past 12 Months?: No CIRT Risk: No Elopement Risk: No Does patient have medical clearance?: No     Disposition:  Disposition Initial Assessment Completed for this Encounter: Yes Disposition of Patient:  (To be seen by the psychiatrist)  On Site Evaluation by:   Reviewed with Physician:    Theadora Rama 04/14/2015 5:41 AM

## 2015-04-14 NOTE — ED Notes (Signed)

## 2015-04-14 NOTE — ED Notes (Signed)
BEHAVIORAL HEALTH ROUNDING Patient sleeping: No. Patient alert and oriented: yes Behavior appropriate: Yes.  ; If no, describe:  Nutrition and fluids offered: Yes  Toileting and hygiene offered: Yes  Sitter present: yes Law enforcement present: Yes, [employer] officer 

## 2015-04-15 ENCOUNTER — Inpatient Hospital Stay
Admission: EM | Admit: 2015-04-15 | Discharge: 2015-04-19 | DRG: 885 | Disposition: A | Discharge status: Home / Self Care | Payer: Medicare Other | Attending: Psychiatry | Admitting: Psychiatry

## 2015-04-15 DIAGNOSIS — F315 Bipolar disorder, current episode depressed, severe, with psychotic features: Secondary | ICD-10-CM

## 2015-04-15 DIAGNOSIS — F1410 Cocaine abuse, uncomplicated: Secondary | ICD-10-CM | POA: Diagnosis present

## 2015-04-15 DIAGNOSIS — M549 Dorsalgia, unspecified: Secondary | ICD-10-CM | POA: Diagnosis present

## 2015-04-15 DIAGNOSIS — R45851 Suicidal ideations: Secondary | ICD-10-CM | POA: Diagnosis present

## 2015-04-15 DIAGNOSIS — F319 Bipolar disorder, unspecified: Secondary | ICD-10-CM | POA: Diagnosis present

## 2015-04-15 DIAGNOSIS — E785 Hyperlipidemia, unspecified: Secondary | ICD-10-CM | POA: Diagnosis present

## 2015-04-15 DIAGNOSIS — F1420 Cocaine dependence, uncomplicated: Secondary | ICD-10-CM | POA: Diagnosis present

## 2015-04-15 DIAGNOSIS — Z9119 Patient's noncompliance with other medical treatment and regimen: Secondary | ICD-10-CM | POA: Diagnosis present

## 2015-04-15 DIAGNOSIS — F17200 Nicotine dependence, unspecified, uncomplicated: Secondary | ICD-10-CM | POA: Diagnosis present

## 2015-04-15 DIAGNOSIS — F17210 Nicotine dependence, cigarettes, uncomplicated: Secondary | ICD-10-CM | POA: Diagnosis present

## 2015-04-15 DIAGNOSIS — F3130 Bipolar disorder, current episode depressed, mild or moderate severity, unspecified: Secondary | ICD-10-CM | POA: Diagnosis not present

## 2015-04-15 LAB — URINALYSIS COMPLETE WITH MICROSCOPIC (ARMC ONLY)
Bacteria, UA: NONE SEEN
Bilirubin Urine: NEGATIVE
Glucose, UA: NEGATIVE mg/dL
Hgb urine dipstick: NEGATIVE
Ketones, ur: NEGATIVE mg/dL
LEUKOCYTES UA: NEGATIVE
Nitrite: NEGATIVE
PH: 6 (ref 5.0–8.0)
PROTEIN: NEGATIVE mg/dL
RBC / HPF: NONE SEEN RBC/hpf (ref 0–5)
SPECIFIC GRAVITY, URINE: 1.019 (ref 1.005–1.030)
Squamous Epithelial / LPF: NONE SEEN
WBC, UA: NONE SEEN WBC/hpf (ref 0–5)

## 2015-04-15 LAB — URINE DRUG SCREEN, QUALITATIVE (ARMC ONLY)
AMPHETAMINES, UR SCREEN: NOT DETECTED
Barbiturates, Ur Screen: NOT DETECTED
Benzodiazepine, Ur Scrn: NOT DETECTED
COCAINE METABOLITE, UR ~~LOC~~: POSITIVE — AB
Cannabinoid 50 Ng, Ur ~~LOC~~: NOT DETECTED
MDMA (ECSTASY) UR SCREEN: NOT DETECTED
METHADONE SCREEN, URINE: NOT DETECTED
Opiate, Ur Screen: NOT DETECTED
Phencyclidine (PCP) Ur S: NOT DETECTED
TRICYCLIC, UR SCREEN: NOT DETECTED

## 2015-04-15 MED ORDER — ACETAMINOPHEN 325 MG PO TABS
650.0000 mg | ORAL_TABLET | Freq: Four times a day (QID) | ORAL | Status: DC | PRN
  Filled 2015-04-15: qty 2

## 2015-04-15 MED ORDER — ZIPRASIDONE HCL 40 MG PO CAPS
40.0000 mg | ORAL_CAPSULE | Freq: Two times a day (BID) | ORAL | Status: DC
  Administered 2015-04-15 – 2015-04-16 (×2): 40 mg via ORAL
  Filled 2015-04-15 (×4): qty 1

## 2015-04-15 MED ORDER — ALUM & MAG HYDROXIDE-SIMETH 200-200-20 MG/5ML PO SUSP
30.0000 mL | ORAL | Status: DC | PRN
  Filled 2015-04-15: qty 30

## 2015-04-15 MED ORDER — MAGNESIUM HYDROXIDE 400 MG/5ML PO SUSP
30.0000 mL | Freq: Every day | ORAL | Status: DC | PRN
  Administered 2015-04-19: 30 mL via ORAL
  Filled 2015-04-15 (×2): qty 30

## 2015-04-15 NOTE — ED Notes (Signed)
Pt sleeping. No known issues at this time. 

## 2015-04-15 NOTE — ED Notes (Signed)

## 2015-04-15 NOTE — Tx Team (Signed)
Initial Interdisciplinary Treatment Plan   PATIENT STRESSORS:    PATIENT STRENGTHS:    PROBLEM LIST: Problem List/Patient Goals Date to be addressed Date deferred Reason deferred Estimated date of resolution  Homeless 04/15/2015     Financial Hardship 04/15/2015     Medication Adjustment 04/15/2015                                          DISCHARGE CRITERIA:     PRELIMINARY DISCHARGE PLAN:   PATIENT/FAMIILY INVOLVEMENT: This treatment plan has been presented to and reviewed with the patient, Wayne Santiago, and/or family member.  The patient and family have been given the opportunity to ask questions and make suggestions.  Wayne Santiago T Wayne Santiago 04/15/2015, 3:06 PM

## 2015-04-15 NOTE — Progress Notes (Signed)
Patient also stated that he has no current outpatient provider; the last time he took medications was 8 months ago.

## 2015-04-15 NOTE — ED Notes (Signed)

## 2015-04-15 NOTE — [facility] Group Notes (Signed)
[facility] Group Notes:  (Nursing/MHT/Case Management/Adjunct)  Date:  04/15/2015  Time:  3:52 PM  Type of Therapy:  Movement Therapy  Participation Level:  Did Not Attend  Summary of Progress/Problems:  Wayne Santiago 04/15/2015, 3:52 PM 

## 2015-04-15 NOTE — Progress Notes (Signed)
Recreation Therapy Notes  Date: 06.23.16 Time: 3:00 pm Location: Craft Room  Group Topic: Leisure Education  Goal Area(s) Addresses:  Patient will identify one positive leisure activity.  Behavioral Response: Did not attend  Intervention: Leisure Time  Activity: Patients were instructed to write down one positive leisure activity. Patients were instructed to completed "Leisure Time Clock" worksheet. Patients were instructed to list 10 positive emotions as a group. Patients were instructed to match the leisure activities with the emotions.   Education: LRT educated patient on what was needed to participate in leisure.   Education Outcome: Patient did not attend group.  Clinical Observations/Feedback: Patient did not attend group.  Jacquelynn Cree, LRT/CTRS 04/15/2015 4:20 PM

## 2015-04-15 NOTE — [facility] Suicide Risk Assessment (Signed)
[facility] Admission Suicide Risk Assessment   Nursing information obtained from:    Demographic factors:    Current Mental Status:    Loss Factors:    Historical Factors:    Risk Reduction Factors:    Total Time spent with patient: 45 minutes Principal Problem: <principal problem not specified> Diagnosis:   Patient Active Problem List   Diagnosis Date Noted  . Bipolar disorder, now depressed [F31.30] 04/15/2015  . Bipolar 1 disorder, depressed [F31.9] 04/14/2015  . Cocaine abuse [F14.10] 04/14/2015     Continued Clinical Symptoms:  Alcohol Use Disorder Identification Test Final Score (AUDIT): 9 The "Alcohol Use Disorders Identification Test", Guidelines for Use in Primary Care, Second Edition.  World Science writer [facility]). Score between 0-7:  no or low risk or alcohol related problems. Score between 8-15:  moderate risk of alcohol related problems. Score between 16-19:  high risk of alcohol related problems. Score 20 or above:  warrants further diagnostic evaluation for alcohol dependence and treatment.   CLINICAL FACTORS:   Bipolar Disorder:   Depressive phase Depression:   Severe Alcohol/Substance Abuse/Dependencies   Musculoskeletal: Strength & Muscle Tone: within normal limits Gait & Station: normal Patient leans: N/A  Psychiatric Specialty Exam: Physical Exam  ROS  Blood pressure 117/81, pulse 73, temperature 97.6 F (36.4 C), temperature source Oral, resp. rate 18, height 6\' 2"  (1.88 m), weight 108.863 kg (240 lb), SpO2 98 %.Body mass index is 30.8 kg/(m^2).  See H and P                                                       COGNITIVE FEATURES THAT CONTRIBUTE TO RISK:  None    SUICIDE RISK:   Moderate:  Frequent suicidal ideation with limited intensity, and duration, some specificity in terms of plans, no associated intent, good self-control, limited dysphoria/symptomatology, some risk factors present, and identifiable protective  factors, including available and accessible social support.  PLAN OF CARE: We'll restart his mood stabilizer, Geodon. We will address substance use disorder through counseling and referrals to enhance abstinence.  Medical Decision Making:  Established Problem, Worsening (2) and Review or order medicine tests (1)  I certify that inpatient services furnished can reasonably be expected to improve the patient's condition.   Wayne Santiago 04/15/2015, 3:01 PM

## 2015-04-15 NOTE — Tx Team (Signed)
Initial Interdisciplinary Treatment Plan   PATIENT STRESSORS: Educational concerns Financial difficulties Medication change or noncompliance Substance abuse   PATIENT STRENGTHS: Ability for insight Capable of independent living Careers information officer for treatment/growth Supportive family/friends   PROBLEM LIST: Problem List/Patient Goals Date to be addressed Date deferred Reason deferred Estimated date of resolution                                                         DISCHARGE CRITERIA:  Ability to meet basic life and health needs Adequate post-discharge living arrangements Improved stabilization in mood, thinking, and/or behavior Motivation to continue treatment in a less acute level of care Safe-care adequate arrangements made Verbal commitment to aftercare and medication compliance Withdrawal symptoms are absent or subacute and managed without 24-hour nursing intervention  PRELIMINARY DISCHARGE PLAN: Attend aftercare/continuing care group Outpatient therapy  PATIENT/FAMIILY INVOLVEMENT: This treatment plan has been presented to and reviewed with the patient, Wayne Santiago, and/or family member, unwilling to get his sister involve in his care at this time.  The patient and family have been given the opportunity to ask questions and make suggestions.  Slade Pierpoint T Kynzie Polgar 04/15/2015, 12:21 PM

## 2015-04-15 NOTE — ED Notes (Signed)
No change in condition will continue to monitor  

## 2015-04-15 NOTE — Progress Notes (Signed)
Arrived to unit in wheel chair by Nursing Staff from [facility] Emergency Department on Voluntary admission to care of Dr. Mayford Knife. A&Ox4, prefers to be called "Bronson",  Initial body and belonging searched for contrabands, no paraphernalia was found on body and in pt belongings. Gross skin intact, no tattoo except for a small brown, un raised birth marks in the lower left of breast area. Denied pain, endorsed SI without any plan. Endorsed using crack cocaine (UDS + for cocaine metabolites). Patient has several psych admissions, education regarding recidivism, tobacco cessation and coping with life stressors provided. Patient smokes 2 packs per day and would need Nicotrol Inhaler to manage nicotine craving. Vague about family support, "I have a sister, and I am not going to give her name or number, she is a busy woman, she works ..." Oriented to the unit and room, new scrubs provided, requested to take a shower

## 2015-04-15 NOTE — ED Notes (Signed)
Pt laying in bed.  

## 2015-04-15 NOTE — H&P (Signed)
Psychiatric Admission Assessment Adult  Patient Identification: Wayne Santiago MRN:  [id number] Date of Evaluation:  04/15/2015 Chief Complaint:  Depression "depression" and "suicidal." Principal Diagnosis: <principal problem not specified> Diagnosis:   Patient Active Problem List   Diagnosis Date Noted  . Bipolar disorder, now depressed [F31.30] 04/15/2015  . Bipolar 1 disorder, depressed [F31.9] 04/14/2015  . Cocaine abuse [F14.10] 04/14/2015   History of Present Illness: Patient indicates that he has been off of his medications for approximately 8 months. He states he was taking Geodon 40 mg a day and Cogentin 1 mg twice a day. He is somewhat vague and guarded about his history. When asked how long he been on Geodon he still could state he cannot be sure but when pressed he stated it had been over a year. He states that his depression has been going on for about a month and his suicidal ideation is been occurring on and off for the past 2 months. When asked about neuro vegetative signs of depression he states his sleep has been good, his energy has been good and his appetite is been good. He states that he has had anhedonia and depressed mood.  Of note the patient relapsed into cocaine use over the past few days. He had been at a homeless shelter for the past few days after being thrown out of his most recent housing situation. Elements:  Duration:  Patient states that his depression has been going on for over a month. He states suicidal ideation has been off and on for the past 2 months.. Associated Signs/Symptoms: Depression Symptoms:  depressed mood, anhedonia, (Hypo) Manic Symptoms:  Denies any of these at this time Anxiety Symptoms:  In eyes anxiety at this time Psychotic Symptoms:  Hallucinations: Auditory PTSD Symptoms: denies at this time Total Time spent with patient: 45 minutes  Past Medical History:  Past Medical History  Diagnosis Date  . Hyperlipemia   . Gall stones     History reviewed. No pertinent past surgical history. Family History: History reviewed. No pertinent family history. Social History:  History  Alcohol Use No     History  Drug Use  . Yes  . Special: Cocaine    History   Social History  . Marital Status: Legally Separated    Spouse Name: N/A  . Number of Children: N/A  . Years of Education: N/A   Social History Main Topics  . Smoking status: Current Every Day Smoker -- 2.00 packs/day for 20 years    Types: Cigarettes  . Smokeless tobacco: Not on file  . Alcohol Use: No  . Drug Use: Yes    Special: Cocaine  . Sexual Activity: Not on file   Other Topics Concern  . None   Social History Narrative   Additional Social History:                          Musculoskeletal: Strength & Muscle Tone: within normal limits Gait & Station: normal Patient leans: N/A  Psychiatric Specialty Exam: Physical Exam  GI: He exhibits distension.    Review of Systems  Constitutional: Negative for fever.  Eyes: Negative for blurred vision and double vision.  Respiratory: Negative for cough and shortness of breath.   Cardiovascular: Negative for chest pain.  Gastrointestinal: Negative for nausea, vomiting, diarrhea and constipation.  Genitourinary: Negative for dysuria.  Musculoskeletal: Positive for back pain (Patient states this is new and just started since he has been in the  hospital.).  Neurological: Negative for dizziness, tingling, tremors and headaches.  Psychiatric/Behavioral: Positive for depression and suicidal ideas. Negative for hallucinations, memory loss and substance abuse. The patient is not nervous/anxious and does not have insomnia.     Blood pressure 117/81, pulse 73, temperature 97.6 F (36.4 C), temperature source Oral, resp. rate 18, height _0  (1.88 m), weight 108.863 kg (240 lb), SpO2 98 %.Body mass index is 30.8 kg/(m^2).  General Appearance: Fairly Groomed  Engineer, water::  Fair  Speech:  Clear  and Coherent and Normal Rate  Volume:  Normal  Mood:  Depressed  Affect:  Constricted  Thought Process:  Linear and Logical  Orientation:  Full (Time, Place, and Person)  Thought Content:  Negative  Suicidal Thoughts:  Yes.  without intent/plan  Homicidal Thoughts:  No  Memory:  Immediate;   Fair Recent;   Fair Remote;   Fair  Judgement:  Fair  Insight:  Fair  Psychomotor Activity:  Negative  Concentration:  Fair  Recall:  [employer] of Knowledge:Fair  Language: Good  Akathisia:  Negative  Handed:  Right unknown   AIMS (if indicated):     Assets:  Desire for Improvement  ADL's:  Intact  Cognition: WNL  Sleep:      Risk to Self: Is patient at risk for suicide?: Yes Risk to Others:   Prior Inpatient Therapy:   Prior Outpatient Therapy:    Alcohol Screening: 1. How often do you have a drink containing alcohol?: 2 to 4 times a month 2. How many drinks containing alcohol do you have on a typical day when you are drinking?: 1 or 2 3. How often do you have six or more drinks on one occasion?: Less than monthly Preliminary Score: 1 4. How often during the last year have you found that you were not able to stop drinking once you had started?: Weekly 5. How often during the last year have you failed to do what was normally expected from you becasue of drinking?: Less than monthly 6. How often during the last year have you needed a first drink in the morning to get yourself going after a heavy drinking session?: Never 7. How often during the last year have you had a feeling of guilt of remorse after drinking?: Never 8. How often during the last year have you been unable to remember what happened the night before because you had been drinking?: Never 9. Have you or someone else been injured as a result of your drinking?: No 10. Has a relative or friend or a doctor or another health worker been concerned about your drinking or suggested you cut down?: Yes, but not in the last  year Alcohol Use Disorder Identification Test Final Score (AUDIT): 9  Allergies:   Allergies  Allergen Reactions  . Penicillins Anaphylaxis   Lab Results:  Results for orders placed or performed during the hospital encounter of 04/14/15 (from the past 48 hour(s))  Acetaminophen level     Status: Abnormal   Collection Time: 04/14/15  2:43 AM  Result Value Ref Range   Acetaminophen (Tylenol), Serum <10 (L) 10 - 30 ug/mL    Comment:        THERAPEUTIC CONCENTRATIONS VARY SIGNIFICANTLY. A RANGE OF 10-30 ug/mL MAY BE AN EFFECTIVE CONCENTRATION FOR MANY PATIENTS. HOWEVER, SOME ARE BEST TREATED AT CONCENTRATIONS OUTSIDE THIS RANGE. ACETAMINOPHEN CONCENTRATIONS >150 ug/mL AT 4 HOURS AFTER INGESTION AND >50 ug/mL AT 12 HOURS AFTER INGESTION ARE OFTEN ASSOCIATED WITH TOXIC  REACTIONS.   CBC     Status: Abnormal   Collection Time: 04/14/15  2:43 AM  Result Value Ref Range   WBC 12.2 (H) 3.8 - 10.6 K/uL   RBC 5.61 4.40 - 5.90 MIL/uL   Hemoglobin 16.4 13.0 - 18.0 g/dL   HCT 48.1 40.0 - 52.0 %   MCV 85.7 80.0 - 100.0 fL   MCH 29.1 26.0 - 34.0 pg   MCHC 34.0 32.0 - 36.0 g/dL   RDW 12.7 11.5 - 14.5 %   Platelets 273 150 - 440 K/uL  Comprehensive metabolic panel     Status: Abnormal   Collection Time: 04/14/15  2:43 AM  Result Value Ref Range   Sodium 137 135 - 145 mmol/L   Potassium 3.6 3.5 - 5.1 mmol/L   Chloride 102 101 - 111 mmol/L   CO2 24 22 - 32 mmol/L   Glucose, Bld 143 (H) 65 - 99 mg/dL   BUN 13 6 - 20 mg/dL   Creatinine, Ser 0.91 0.61 - 1.24 mg/dL   Calcium 9.0 8.9 - 10.3 mg/dL   Total Protein 8.3 (H) 6.5 - 8.1 g/dL   Albumin 5.0 3.5 - 5.0 g/dL   AST 38 15 - 41 U/L   ALT 45 17 - 63 U/L   Alkaline Phosphatase 57 38 - 126 U/L   Total Bilirubin 0.7 0.3 - 1.2 mg/dL   GFR calc non Af Amer >60 >60 mL/min   GFR calc Af Amer >60 >60 mL/min    Comment: (NOTE) The eGFR has been calculated using the CKD EPI equation. This calculation has not been validated in all clinical  situations. eGFR's persistently <60 mL/min signify possible Chronic Kidney Disease.    Anion gap 11 5 - 15  Ethanol (ETOH)     Status: None   Collection Time: 04/14/15  2:43 AM  Result Value Ref Range   Alcohol, Ethyl (B) <5 <5 mg/dL    Comment:        LOWEST DETECTABLE LIMIT FOR SERUM ALCOHOL IS 5 mg/dL FOR MEDICAL PURPOSES ONLY   Salicylate level     Status: None   Collection Time: 04/14/15  2:43 AM  Result Value Ref Range   Salicylate Lvl <5.6 2.8 - 30.0 mg/dL  Urine Drug Screen, Qualitative ([facility] only)     Status: Abnormal   Collection Time: 04/15/15  8:29 AM  Result Value Ref Range   Tricyclic, Ur Screen NONE DETECTED NONE DETECTED   Amphetamines, Ur Screen NONE DETECTED NONE DETECTED   MDMA (Ecstasy)Ur Screen NONE DETECTED NONE DETECTED   Cocaine Metabolite,Ur [place] POSITIVE (A) NONE DETECTED   Opiate, Ur Screen NONE DETECTED NONE DETECTED   Phencyclidine (PCP) Ur S NONE DETECTED NONE DETECTED   Cannabinoid 50 Ng, Ur [place] NONE DETECTED NONE DETECTED   Barbiturates, Ur Screen NONE DETECTED NONE DETECTED   Benzodiazepine, Ur Scrn NONE DETECTED NONE DETECTED   Methadone Scn, Ur NONE DETECTED NONE DETECTED    Comment: (NOTE) 433  Tricyclics, urine               Cutoff 1000 ng/mL 200  Amphetamines, urine             Cutoff 1000 ng/mL 300  MDMA (Ecstasy), urine           Cutoff 500 ng/mL 400  Cocaine Metabolite, urine       Cutoff 300 ng/mL 500  Opiate, urine  Cutoff 300 ng/mL 600  Phencyclidine (PCP), urine      Cutoff 25 ng/mL 700  Cannabinoid, urine              Cutoff 50 ng/mL 800  Barbiturates, urine             Cutoff 200 ng/mL 900  Benzodiazepine, urine           Cutoff 200 ng/mL 1000 Methadone, urine                Cutoff 300 ng/mL 1100 1200 The urine drug screen provides only a preliminary, unconfirmed 1300 analytical test result and should not be used for non-medical 1400 purposes. Clinical consideration and professional judgment should 1500 be  applied to any positive drug screen result due to possible 1600 interfering substances. A more specific alternate chemical method 1700 must be used in order to obtain a confirmed analytical result.  1800 Gas chromato graphy / mass spectrometry (GC/MS) is the preferred 1900 confirmatory method.   Urinalysis complete, with microscopic ([facility] only)     Status: Abnormal   Collection Time: 04/15/15  8:29 AM  Result Value Ref Range   Color, Urine YELLOW (A) YELLOW   APPearance CLEAR (A) CLEAR   Glucose, UA NEGATIVE NEGATIVE mg/dL   Bilirubin Urine NEGATIVE NEGATIVE   Ketones, ur NEGATIVE NEGATIVE mg/dL   Specific Gravity, Urine 1.019 1.005 - 1.030   Hgb urine dipstick NEGATIVE NEGATIVE   pH 6.0 5.0 - 8.0   Protein, ur NEGATIVE NEGATIVE mg/dL   Nitrite NEGATIVE NEGATIVE   Leukocytes, UA NEGATIVE NEGATIVE   RBC / HPF NONE SEEN 0 - 5 RBC/hpf   WBC, UA NONE SEEN 0 - 5 WBC/hpf   Bacteria, UA NONE SEEN NONE SEEN   Squamous Epithelial / LPF NONE SEEN NONE SEEN   Mucous PRESENT    Current Medications: Current Facility-Administered Medications  Medication Dose Route Frequency Provider Last Rate Last Dose  . acetaminophen (TYLENOL) tablet 650 mg  650 mg Oral Q6H PRN Gonzella Lex, MD      . alum & mag hydroxide-simeth (MAALOX/MYLANTA) 200-200-20 MG/5ML suspension 30 mL  30 mL Oral Q4H PRN Gonzella Lex, MD      . magnesium hydroxide (MILK OF MAGNESIA) suspension 30 mL  30 mL Oral Daily PRN Gonzella Lex, MD      . ziprasidone (GEODON) capsule 40 mg  40 mg Oral BID WC Gonzella Lex, MD       PTA Medications: Prescriptions prior to admission  Medication Sig Dispense Refill Last Dose  . benztropine (COGENTIN) 1 MG tablet Take 1 mg by mouth 2 (two) times daily.   Not Taking at Unknown time  . ziprasidone (GEODON) 40 MG capsule Take 40 mg by mouth at bedtime.   Not Taking at Unknown time    Previous Psychotropic Medications: Yes   Substance Abuse History in the last 12 months:  Yes.       Consequences of Substance Abuse: Given history possible housing  Results for orders placed or performed during the hospital encounter of 04/14/15 (from the past 72 hour(s))  Acetaminophen level     Status: Abnormal   Collection Time: 04/14/15  2:43 AM  Result Value Ref Range   Acetaminophen (Tylenol), Serum <10 (L) 10 - 30 ug/mL    Comment:        THERAPEUTIC CONCENTRATIONS VARY SIGNIFICANTLY. A RANGE OF 10-30 ug/mL MAY BE AN EFFECTIVE CONCENTRATION FOR MANY PATIENTS. HOWEVER, SOME ARE  BEST TREATED AT CONCENTRATIONS OUTSIDE THIS RANGE. ACETAMINOPHEN CONCENTRATIONS >150 ug/mL AT 4 HOURS AFTER INGESTION AND >50 ug/mL AT 12 HOURS AFTER INGESTION ARE OFTEN ASSOCIATED WITH TOXIC REACTIONS.   CBC     Status: Abnormal   Collection Time: 04/14/15  2:43 AM  Result Value Ref Range   WBC 12.2 (H) 3.8 - 10.6 K/uL   RBC 5.61 4.40 - 5.90 MIL/uL   Hemoglobin 16.4 13.0 - 18.0 g/dL   HCT 48.1 40.0 - 52.0 %   MCV 85.7 80.0 - 100.0 fL   MCH 29.1 26.0 - 34.0 pg   MCHC 34.0 32.0 - 36.0 g/dL   RDW 12.7 11.5 - 14.5 %   Platelets 273 150 - 440 K/uL  Comprehensive metabolic panel     Status: Abnormal   Collection Time: 04/14/15  2:43 AM  Result Value Ref Range   Sodium 137 135 - 145 mmol/L   Potassium 3.6 3.5 - 5.1 mmol/L   Chloride 102 101 - 111 mmol/L   CO2 24 22 - 32 mmol/L   Glucose, Bld 143 (H) 65 - 99 mg/dL   BUN 13 6 - 20 mg/dL   Creatinine, Ser 0.91 0.61 - 1.24 mg/dL   Calcium 9.0 8.9 - 10.3 mg/dL   Total Protein 8.3 (H) 6.5 - 8.1 g/dL   Albumin 5.0 3.5 - 5.0 g/dL   AST 38 15 - 41 U/L   ALT 45 17 - 63 U/L   Alkaline Phosphatase 57 38 - 126 U/L   Total Bilirubin 0.7 0.3 - 1.2 mg/dL   GFR calc non Af Amer >60 >60 mL/min   GFR calc Af Amer >60 >60 mL/min    Comment: (NOTE) The eGFR has been calculated using the CKD EPI equation. This calculation has not been validated in all clinical situations. eGFR's persistently <60 mL/min signify possible Chronic Kidney Disease.     Anion gap 11 5 - 15  Ethanol (ETOH)     Status: None   Collection Time: 04/14/15  2:43 AM  Result Value Ref Range   Alcohol, Ethyl (B) <5 <5 mg/dL    Comment:        LOWEST DETECTABLE LIMIT FOR SERUM ALCOHOL IS 5 mg/dL FOR MEDICAL PURPOSES ONLY   Salicylate level     Status: None   Collection Time: 04/14/15  2:43 AM  Result Value Ref Range   Salicylate Lvl <1.9 2.8 - 30.0 mg/dL  Urine Drug Screen, Qualitative ([facility] only)     Status: Abnormal   Collection Time: 04/15/15  8:29 AM  Result Value Ref Range   Tricyclic, Ur Screen NONE DETECTED NONE DETECTED   Amphetamines, Ur Screen NONE DETECTED NONE DETECTED   MDMA (Ecstasy)Ur Screen NONE DETECTED NONE DETECTED   Cocaine Metabolite,Ur [place] POSITIVE (A) NONE DETECTED   Opiate, Ur Screen NONE DETECTED NONE DETECTED   Phencyclidine (PCP) Ur S NONE DETECTED NONE DETECTED   Cannabinoid 50 Ng, Ur [place] NONE DETECTED NONE DETECTED   Barbiturates, Ur Screen NONE DETECTED NONE DETECTED   Benzodiazepine, Ur Scrn NONE DETECTED NONE DETECTED   Methadone Scn, Ur NONE DETECTED NONE DETECTED    Comment: (NOTE) 509  Tricyclics, urine               Cutoff 1000 ng/mL 200  Amphetamines, urine             Cutoff 1000 ng/mL 300  MDMA (Ecstasy), urine           Cutoff 500 ng/mL 400  Cocaine Metabolite, urine       Cutoff 300 ng/mL 500  Opiate, urine                   Cutoff 300 ng/mL 600  Phencyclidine (PCP), urine      Cutoff 25 ng/mL 700  Cannabinoid, urine              Cutoff 50 ng/mL 800  Barbiturates, urine             Cutoff 200 ng/mL 900  Benzodiazepine, urine           Cutoff 200 ng/mL 1000 Methadone, urine                Cutoff 300 ng/mL 1100 1200 The urine drug screen provides only a preliminary, unconfirmed 1300 analytical test result and should not be used for non-medical 1400 purposes. Clinical consideration and professional judgment should 1500 be applied to any positive drug screen result due to possible 1600 interfering substances. A more  specific alternate chemical method 1700 must be used in order to obtain a confirmed analytical result.  1800 Gas chromato graphy / mass spectrometry (GC/MS) is the preferred 1900 confirmatory method.   Urinalysis complete, with microscopic ([facility] only)     Status: Abnormal   Collection Time: 04/15/15  8:29 AM  Result Value Ref Range   Color, Urine YELLOW (A) YELLOW   APPearance CLEAR (A) CLEAR   Glucose, UA NEGATIVE NEGATIVE mg/dL   Bilirubin Urine NEGATIVE NEGATIVE   Ketones, ur NEGATIVE NEGATIVE mg/dL   Specific Gravity, Urine 1.019 1.005 - 1.030   Hgb urine dipstick NEGATIVE NEGATIVE   pH 6.0 5.0 - 8.0   Protein, ur NEGATIVE NEGATIVE mg/dL   Nitrite NEGATIVE NEGATIVE   Leukocytes, UA NEGATIVE NEGATIVE   RBC / HPF NONE SEEN 0 - 5 RBC/hpf   WBC, UA NONE SEEN 0 - 5 WBC/hpf   Bacteria, UA NONE SEEN NONE SEEN   Squamous Epithelial / LPF NONE SEEN NONE SEEN   Mucous PRESENT     Observation Level/Precautions:  15 minute checks  Laboratory:  [facility] were significant for a white count of 12.2. His toxicology screen was positive for cocaine and amphetamines.  Psychotherapy:    Medications:    Consultations:    Discharge Concerns:    Estimated LOS:  Other:     Psychological Evaluations: No   Treatment Plan Summary: Daily contact with patient to assess and evaluate symptoms and progress in treatment and Medication management  Medical Decision Making:  Established Problem, Worsening (2) and Review of Medication Regimen & Side Effects (2)   Depression-we'll continue Geodon. Patient is not interested in medication adjustments or additional medications. However tomorrow we'll discuss with him the use of antidepressants.  Substance Use-tox screen positive for stimulants. We will discuss options for outpatient therapy and also group therapy within the milieu  Back pain-patient has Tylenol as needed however he declines pain medicine at this time.    I certify that  inpatient services furnished can reasonably be expected to improve the patient's condition.   Bader Stubblefield 6/23/20162:46 PM

## 2015-04-15 NOTE — ED Notes (Signed)
BEHAVIORAL HEALTH ROUNDING Patient sleeping: No. Patient alert and oriented: yes Behavior appropriate: Yes.  ; If no, describe:  Nutrition and fluids offered: Yes  Toileting and hygiene offered: Yes  Sitter present: no Law enforcement present: Yes  

## 2015-04-15 NOTE — Progress Notes (Signed)
When Security and the Nurse Assistant told the patient that the Chaplain was available to speak with him, he refused to meet with the Chaplain.  Wayne Santiago, Chaplain  [phone]

## 2015-04-16 LAB — CBC WITH DIFFERENTIAL/PLATELET
Basophils Absolute: 0.1 10*3/uL (ref 0–0.1)
Basophils Relative: 1 %
Eosinophils Absolute: 0.4 10*3/uL (ref 0–0.7)
Eosinophils Relative: 4 %
HEMATOCRIT: 47.6 % (ref 40.0–52.0)
Hemoglobin: 16 g/dL (ref 13.0–18.0)
LYMPHS ABS: 3.1 10*3/uL (ref 1.0–3.6)
Lymphocytes Relative: 35 %
MCH: 29.1 pg (ref 26.0–34.0)
MCHC: 33.7 g/dL (ref 32.0–36.0)
MCV: 86.2 fL (ref 80.0–100.0)
MONO ABS: 0.7 10*3/uL (ref 0.2–1.0)
Monocytes Relative: 8 %
Neutro Abs: 4.7 10*3/uL (ref 1.4–6.5)
Neutrophils Relative %: 52 %
PLATELETS: 256 10*3/uL (ref 150–440)
RBC: 5.52 MIL/uL (ref 4.40–5.90)
RDW: 13.2 % (ref 11.5–14.5)
WBC: 9 10*3/uL (ref 3.8–10.6)

## 2015-04-16 MED ORDER — ESCITALOPRAM OXALATE 10 MG PO TABS
10.0000 mg | ORAL_TABLET | Freq: Every day | ORAL | Status: DC
  Administered 2015-04-16: 10 mg via ORAL
  Filled 2015-04-16 (×4): qty 1

## 2015-04-16 NOTE — Progress Notes (Signed)
Continues to endorse depression.  Denies SI.  Isolates to his room with no group attendance.  Up to dayroom for meals.  No interaction noted with peers.   Refused 0900 geodon, states "I only take 40 mg once a day"  Safety maintained.

## 2015-04-16 NOTE — Plan of Care (Signed)
Problem: [facility] Participation in Recreation Therapeutic Interventions Goal: STG-Patient will demonstrate improved self esteem by identif STG: Self-Esteem - Within 5 treatment sessions, patient will verbalize at least 5 positive affirmation statements in each of 3 treatment sessions to increase self-esteem post d/c.  Outcome: Progressing Treatment Session 1; Completed 1 out of 3: At approximately 4:10 pm, LRT met with patient in patient room. Patient verbalized 5 positive affirmation statements. Patient reported it felt "good". LRT encouraged patient to continue saying positive affirmation statements.  Leonette Monarch, LRT/CTRS 06.24.16 5:33 pm Goal: STG-Patient will identify at least five coping skills for ** STG: Coping Skills - Within 4 treatment sessions, patient will verbalize at least 5 coping skills for substance abuse in each of 2 treatment sessions to decrease substance abuse post d/c.  Outcome: Progressing Treatment Session 1; Completed 1 out of 2: At approximately 4:10 pm, LRT met with patient in patient room. Patient verbalized 5 coping skills for substance abuse. LRT educated patient on leisure and why it is important to implement into his schedule. LRT provided patient with blank schedules to help him plan his day and try to avoid using substances. LRT educated patient on healthy support systems.   Leonette Monarch, LRT/CTRS 06.24.16 5:35 pm

## 2015-04-16 NOTE — Progress Notes (Signed)
Recreation Therapy Notes  INPATIENT RECREATION THERAPY ASSESSMENT  Patient Details Name: Wayne Santiago MRN: [id number] DOB: 10/07/1971 Today's Date: 04/16/2015  Patient Stressors: Friends, Other (Comment) (lack of friends; homeless)  Coping Skills:   Isolate, Substance Abuse, Exercise, Art/Dance, Music, Sports  Personal Challenges: Decision-Making, Problem-Solving, Self-Esteem/Confidence, Substance Abuse, Trusting Others  Leisure Interests (2+):  Music - Listen, Individual - Other (Comment) (Talk to people)  Awareness of Community Resources:  No  Community Resources:     Current Use:    If no, Barriers?:    Patient Strengths:  "I don't know"  Patient Identified Areas of Improvement:  "I don't know"  Current Recreation Participation:  Nothing  Patient Goal for Hospitalization:  "I haven't made one yet."  [place] of Residence:  [place] of Residence:  [place]   Current SI (including self-harm):  No  Current HI:  No  Consent to Intern Participation: N/A   Jacquelynn Cree, LRT/CTRS 04/16/2015, 2:56 PM

## 2015-04-16 NOTE — [facility] Group Notes (Signed)
[facility] LCSW Aftercare Discharge Planning Group Note  04/16/2015 10:58 AM  Participation Quality:  Patient did not attend group  Affect:  n/a  Cognitive:  n/a  Insight:  n/a  Engagement in Group:  n/a  Modes of Intervention:  n/a  Summary of Progress/Problems:  Beryl Meager T 04/16/2015, 10:58 AM

## 2015-04-16 NOTE — [facility] Group Notes (Signed)
[facility] LCSW Group Therapy  04/16/2015 4:05 PM  Type of Therapy:  Group Therapy  Participation Level:  Did Not Attend  Modes of Intervention:  Discussion, Education, Problem-solving, Socialization and Support  Summary of Progress/Problems:Feelings around relapse and recovery: Pt will discuss emotions they experience before and after a relapse. Pt will be encouraged to explore feelings around recovery.   [employer] MSW, LCSWA  04/16/2015, 4:05 PM

## 2015-04-16 NOTE — Tx Team (Signed)
Interdisciplinary Treatment Plan Update (Adult)  Date:  04/16/2015 Time Reviewed:  11:30 AM  Progress in Treatment: Attending groups: No. Participating in groups:  No. Taking medication as prescribed:  Yes. Tolerating medication:  Yes. Family/Significant othe contact made:  No, will contact:  if patient provides consent Patient understands diagnosis:  Yes. Discussing patient identified problems/goals with staff:  Yes. Medical problems stabilized or resolved:  Yes. Denies suicidal/homicidal ideation: No. Issues/concerns per patient self-inventory:  No. Other:  New problem(s) identified: No, Describe:  none reported  Discharge Plan or Barriers: Patient is homeless and will need housing at discharge. Patient will also need hospital follow up care arranged at discharge.   Reason for Continuation of Hospitalization: Depression Suicidal ideation  Comments:  Estimated length of stay: up to 4 days  New goal(s):  Review of initial/current patient goals per problem list:   See Care Plan  Attendees: Physician:  Wallace Going, MD 6/24/201611:30 AM  Nursing:   Leonia Reader, RN 6/24/201611:30 AM  Case Manager:   6/24/201611:30 AM  Counselor:   6/24/201611:30 AM  Other:  Beryl Meager, LCSWA 6/24/201611:30 AM  Other:  Jake Shark, LCSW 6/24/201611:30 AM  Other:  Carola Frost, Psych D. 6/24/201611:30 AM  Other: Hershal Coria, LRT 6/24/201611:30 AM  Other:  6/24/201611:30 AM  Other:  6/24/201611:30 AM  Other:  6/24/201611:30 AM  Other:  6/24/201611:30 AM  Other:  6/24/201611:30 AM  Other:   6/24/201611:30 AM   Scribe for Treatment Team:   Beryl Meager T, 04/16/2015, 11:30 AM

## 2015-04-16 NOTE — Plan of Care (Signed)
Problem: Spiritual Needs Goal: Ability to function at adequate level Outcome: Progressing Maintains personal care chores

## 2015-04-16 NOTE — Progress Notes (Signed)
D: Pt is asleep in bed this evening. Pt mood is depressed and his affect is flat. Pt avoids interaction and is reclusive.  A: Writer promoted sleep and provided emotional support.  R: Pt went to sleep early and remained in bed throughout the night.

## 2015-04-16 NOTE — [facility] Group Notes (Signed)
[facility] Group Notes:  (Nursing/MHT/Case Management/Adjunct)  Date:  04/16/2015  Time:  11:46 AM  Type of Therapy:  Group Therapy  Participation Level:  Did Not Attend  Summary of Progress/Problems:  Wayne Santiago Wayne Santiago [place] 04/16/2015, 11:46 AM

## 2015-04-16 NOTE — [facility] Group Notes (Signed)
[facility] Group Notes:  (Nursing/MHT/Case Management/Adjunct)  Date:  04/16/2015  Time:  11:36 PM  Type of Therapy:  Group Therapy  Participation Level:  Did Not Attend    Wayne Santiago 04/16/2015, 11:36 PM 

## 2015-04-16 NOTE — Plan of Care (Signed)
Problem: Ineffective individual coping Goal: STG: Patient will remain free from self harm Outcome: Progressing No self harm.  Denies SI

## 2015-04-16 NOTE — Progress Notes (Signed)
Recreation Therapy Notes  Date: 06.24.16 Time: 3:00 pm Location: Craft Room  Group Topic: Problem Solving, Communication, Teamwork  Goal Area(s) Addresses:  Patient will work in teams towards shared goal. Patient will verbalize skills needed to make activity successful. Patient will verbalize benefit of using skills identified to reach post d/c goals.  Behavioral Response: Did not attend  Intervention: Landing Pad  Activity: Patients were given 15 straws and approximately 2.5 feet of tape and instructed to build a landing pad to catch a golf ball.  Education: LRT educated patients on why communication, teamwork, and problem solving are important.  Education Outcome: Patient did not attend group.  Clinical Observations/Feedback: Patient did not attend group.  Jacquelynn Cree, LRT/CTRS 04/16/2015 5:06 PM

## 2015-04-16 NOTE — Progress Notes (Signed)
[facility] MD Progress Note  04/16/2015 11:29 AM Wayne Santiago  MRN:  [id number] Subjective:  Patient does not appear to be interacting or attending groups or participating in treatment. When discussed with the patient he indicates he's just trying to catch up on sleep. He states he still depressed. I asked if he wants take an antidepressant and he stated he's willing to try something. I reviewed history of any past antidepressants and patient indicated he had not had any prior trials of antidepressants. Risk and benefits of Lexapro were discussed in patient's able to consent.  When asked about being suicidal patient stated "still suicidal." Does appear from observations patient is most interested in sleeping. Principal Problem: <principal problem not specified> Diagnosis:   Patient Active Problem List   Diagnosis Date Noted  . Bipolar disorder, now depressed [F31.30] 04/15/2015  . Bipolar 1 disorder, depressed [F31.9] 04/14/2015  . Cocaine abuse [F14.10] 04/14/2015   Total Time spent with patient: 15 minutes   Past Medical History:  Past Medical History  Diagnosis Date  . Hyperlipemia   . Gall stones    History reviewed. No pertinent past surgical history. Family History: History reviewed. No pertinent family history. Social History:  History  Alcohol Use No     History  Drug Use  . Yes  . Special: Cocaine    History   Social History  . Marital Status: Legally Separated    Spouse Name: N/A  . Number of Children: N/A  . Years of Education: N/A   Social History Main Topics  . Smoking status: Current Every Day Smoker -- 2.00 packs/day for 20 years    Types: Cigarettes  . Smokeless tobacco: Not on file  . Alcohol Use: No  . Drug Use: Yes    Special: Cocaine  . Sexual Activity: Not on file   Other Topics Concern  . None   Social History Narrative   Additional History:    Sleep: Good  Appetite:  Good   Assessment:   Musculoskeletal: Strength & Muscle Tone:  within normal limits Gait & Station: normal Patient leans: N/A   Psychiatric Specialty Exam: Physical Exam  ROS  Blood pressure 118/82, pulse 70, temperature 97.5 F (36.4 C), temperature source Oral, resp. rate 18, height  (1.88 m), weight 108.863 kg (240 lb), SpO2 98 %.Body mass index is 30.8 kg/(m^2).  General Appearance: Well Groomed  Patent attorney::  Fair  Speech:  Clear and Coherent and Normal Rate  Volume:  Normal  Mood:  Still depressed  Affect:  Constricted  Thought Process:  Linear and Logical  Orientation:  Full (Time, Place, and Person)  Thought Content:  Negative  Suicidal Thoughts:  No  Homicidal Thoughts:  No  Memory:  Immediate;   Good Recent;   Good Remote;   Good  Judgement:  Fair  Insight:  Fair  Psychomotor Activity:  Negative  Concentration:  Good  Recall:  Fair  Fund of Knowledge:Fair  Language: Fair  Akathisia:  Negative  Handed:  Right  AIMS (if indicated):     Assets:  Others:  Patient is not typically cooperative however history indicates some social supports in the past  ADL's:  Intact  Cognition: WNL  Sleep:  Number of Hours: 8     Current Medications: Current Facility-Administered Medications  Medication Dose Route Frequency Provider Last Rate Last Dose  . acetaminophen (TYLENOL) tablet 650 mg  650 mg Oral Q6H PRN Audery Amel, MD      . alum &  mag hydroxide-simeth (MAALOX/MYLANTA) 200-200-20 MG/5ML suspension 30 mL  30 mL Oral Q4H PRN Audery Amel, MD      . magnesium hydroxide (MILK OF MAGNESIA) suspension 30 mL  30 mL Oral Daily PRN Audery Amel, MD      . ziprasidone (GEODON) capsule 40 mg  40 mg Oral BID WC Audery Amel, MD   40 mg at 04/15/15 1919    Lab Results:  Results for orders placed or performed during the hospital encounter of 04/15/15 (from the past 48 hour(s))  CBC with Differential/Platelet     Status: None   Collection Time: 04/16/15  6:51 AM  Result Value Ref Range   WBC 9.0 3.8 - 10.6 K/uL   RBC 5.52  4.40 - 5.90 MIL/uL   Hemoglobin 16.0 13.0 - 18.0 g/dL   HCT 78.9 38.1 - 01.7 %   MCV 86.2 80.0 - 100.0 fL   MCH 29.1 26.0 - 34.0 pg   MCHC 33.7 32.0 - 36.0 g/dL   RDW 51.0 25.8 - 52.7 %   Platelets 256 150 - 440 K/uL   Neutrophils Relative % 52 %   Neutro Abs 4.7 1.4 - 6.5 K/uL   Lymphocytes Relative 35 %   Lymphs Abs 3.1 1.0 - 3.6 K/uL   Monocytes Relative 8 %   Monocytes Absolute 0.7 0.2 - 1.0 K/uL   Eosinophils Relative 4 %   Eosinophils Absolute 0.4 0 - 0.7 K/uL   Basophils Relative 1 %   Basophils Absolute 0.1 0 - 0.1 K/uL    Physical Findings: AIMS: Facial and Oral Movements Muscles of Facial Expression: None, normal Lips and Perioral Area: None, normal Jaw: None, normal Tongue: None, normal,Extremity Movements Upper (arms, wrists, hands, fingers): None, normal Lower (legs, knees, ankles, toes): None, normal, Trunk Movements Neck, shoulders, hips: None, normal, Overall Severity Severity of abnormal movements (highest score from questions above): None, normal Incapacitation due to abnormal movements: None, normal Patient's awareness of abnormal movements (rate only patient's report): No Awareness, Dental Status Current problems with teeth and/or dentures?: No Does patient usually wear dentures?: No  CIWA:  CIWA-Ar Total: 0 COWS:     Treatment Plan Summary: Daily contact with patient to assess and evaluate symptoms and progress in treatment and Medication management   Medical Decision Making:  Established Problem, Worsening (2)   Depression-we'll continue Geodon. Patient is not interested in medication adjustments or additional medications. Will start Lexapro 10 mg a day.  Substance Use-tox screen positive for stimulants. We will discuss options for outpatient therapy and also group therapy within the milieu  Back pain-patient has Tylenol as needed however he declines pain medicine at this time.     Wayne Santiago 04/16/2015, 11:29 AM

## 2015-04-16 NOTE — [facility] Group Notes (Signed)
[facility] Group Notes:  (Nursing/MHT/Case Management/Adjunct)  Date:  04/16/2015  Time:  4:15 AM  Type of Therapy:  Group Therapy  Participation Level:  Did Not Attend   Summary of Progress/Problems:  Veva Holes 04/16/2015, 4:15 AM

## 2015-04-17 MED ORDER — ZIPRASIDONE HCL 40 MG PO CAPS
40.0000 mg | ORAL_CAPSULE | Freq: Every day | ORAL | Status: DC
  Administered 2015-04-17 – 2015-04-19 (×2): 40 mg via ORAL
  Filled 2015-04-17 (×2): qty 1

## 2015-04-17 NOTE — Progress Notes (Signed)
D: Pt denies SI/HI/AVH. Pt isolates to room, minimal interaction with peers and staff. Pt endorses depression.  A: Pt was offered support and encouragement. Pt was encouraged to attend groups. Q 15 minute checks were done for safety.  R: Pt did not attend groups meeting. Pt has no complaints.Pt receptive to treatment and safety maintained on unit.

## 2015-04-17 NOTE — Plan of Care (Signed)
Problem: Ineffective individual coping Goal: LTG: Patient will report a decrease in negative feelings Outcome: Not Progressing Reluctant to talk with staff

## 2015-04-17 NOTE — Plan of Care (Signed)
Problem: Diagnosis: Increased Risk For Suicide Attempt Goal: LTG-Patient Will Show Positive Response to Medication LTG (by discharge) : Patient will show positive response to medication and will participate in the development of the discharge plan.  Outcome: Not Progressing Refusing lexapro and only agreeable to take 1 dose of geodon daily

## 2015-04-17 NOTE — Plan of Care (Signed)
Problem: Ineffective individual coping Goal: LTG: Patient will report a decrease in negative feelings Outcome: Progressing Patient denies HI/SI/AVH.

## 2015-04-17 NOTE — [facility] Group Notes (Signed)
[facility] LCSW Group Therapy  04/17/2015 3:27 PM  Type of Therapy:  Group Therapy  Participation Level:  Did Not Attend  Modes of Intervention:  Discussion, Education, Socialization and Support  Summary of Progress/Problems:Pt will identify unhealthy thoughts and how they impact their emotions and behavior. Pt will be encouraged to discuss these thoughts, emotions and behaviors with the group. Pt will be asked to pick a positive affirmation and discuss what emotion they experienced.    Capri Veals L Imir Brumbach MSW, LCSWA  04/17/2015, 3:27 PM  

## 2015-04-17 NOTE — Progress Notes (Signed)
No group attendance.  Only out of room for med pass and meals. Continues to be guarded with conversations.  Safety maintained.

## 2015-04-17 NOTE — Progress Notes (Addendum)
[facility] MD Progress Note  04/17/2015 1:45 PM Wayne Santiago  MRN:  [id number] Subjective:  Patient does not appear to be interacting or attending groups or participating in treatment. He related that he try the Lexapro yesterday but had a "bad reaction." When asked what the reaction was he stated he felt like agitated and he wanted to kill himself. I did discuss there be other options for other antidepressants. He states he does not want to take anything else.  When asked about being suicidal patient stated it is now "off and on." Does appear from observations patient is most interested in sleeping. He does appear to make it to meals as well. Principal Problem: <principal problem not specified> Diagnosis:   Patient Active Problem List   Diagnosis Date Noted  . Bipolar disorder, now depressed [F31.30] 04/15/2015  . Bipolar 1 disorder, depressed [F31.9] 04/14/2015  . Cocaine abuse [F14.10] 04/14/2015   Total Time spent with patient: 15 minutes   Past Medical History:  Past Medical History  Diagnosis Date  . Hyperlipemia   . Gall stones    History reviewed. No pertinent past surgical history. Family History: History reviewed. No pertinent family history. Social History:  History  Alcohol Use No     History  Drug Use  . Yes  . Special: Cocaine    History   Social History  . Marital Status: Legally Separated    Spouse Name: N/A  . Number of Children: N/A  . Years of Education: N/A   Social History Main Topics  . Smoking status: Current Every Day Smoker -- 2.00 packs/day for 20 years    Types: Cigarettes  . Smokeless tobacco: Not on file  . Alcohol Use: No  . Drug Use: Yes    Special: Cocaine  . Sexual Activity: Not on file   Other Topics Concern  . None   Social History Narrative   Additional History:    Sleep: Good  Appetite:  Good   Assessment:   Musculoskeletal: Strength & Muscle Tone: within normal limits Gait & Station: normal Patient leans:  N/A   Psychiatric Specialty Exam: Physical Exam  ROS  Blood pressure 118/82, pulse 70, temperature 97.5 F (36.4 C), temperature source Oral, resp. rate 18, height  (1.88 m), weight 108.863 kg (240 lb), SpO2 98 %.Body mass index is 30.8 kg/(m^2).  General Appearance: Well Groomed  Patent attorney::  Fair  Speech:  Clear and Coherent and Normal Rate  Volume:  Normal  Mood:  Still depressed  Affect:  Constricted  Thought Process:  Linear and Logical  Orientation:  Full (Time, Place, and Person)  Thought Content:  Negative  Suicidal Thoughts:  No  Homicidal Thoughts:  No  Memory:  Immediate;   Good Recent;   Good Remote;   Good  Judgement:  Fair  Insight:  Fair  Psychomotor Activity:  Negative  Concentration:  Good  Recall:  Fair  Fund of Knowledge:Fair  Language: Fair  Akathisia:  Negative  Handed:  Right  AIMS (if indicated):     Assets:  Others:  Patient is not typically cooperative however history indicates some social supports in the past  ADL's:  Intact  Cognition: WNL  Sleep:  Number of Hours: 8.75     Current Medications: Current Facility-Administered Medications  Medication Dose Route Frequency Provider Last Rate Last Dose  . acetaminophen (TYLENOL) tablet 650 mg  650 mg Oral Q6H PRN Audery Amel, MD      . alum & Jodelle Green  hydroxide-simeth (MAALOX/MYLANTA) 200-200-20 MG/5ML suspension 30 mL  30 mL Oral Q4H PRN Audery Amel, MD      . escitalopram (LEXAPRO) tablet 10 mg  10 mg Oral Daily Kerin Salen, MD   10 mg at 04/16/15 1215  . magnesium hydroxide (MILK OF MAGNESIA) suspension 30 mL  30 mL Oral Daily PRN Audery Amel, MD      . ziprasidone (GEODON) capsule 40 mg  40 mg Oral BID WC Audery Amel, MD   40 mg at 04/16/15 1704    Lab Results:  Results for orders placed or performed during the hospital encounter of 04/15/15 (from the past 48 hour(s))  CBC with Differential/Platelet     Status: None   Collection Time: 04/16/15  6:51 AM  Result Value Ref  Range   WBC 9.0 3.8 - 10.6 K/uL   RBC 5.52 4.40 - 5.90 MIL/uL   Hemoglobin 16.0 13.0 - 18.0 g/dL   HCT 96.0 45.4 - 09.8 %   MCV 86.2 80.0 - 100.0 fL   MCH 29.1 26.0 - 34.0 pg   MCHC 33.7 32.0 - 36.0 g/dL   RDW 11.9 14.7 - 82.9 %   Platelets 256 150 - 440 K/uL   Neutrophils Relative % 52 %   Neutro Abs 4.7 1.4 - 6.5 K/uL   Lymphocytes Relative 35 %   Lymphs Abs 3.1 1.0 - 3.6 K/uL   Monocytes Relative 8 %   Monocytes Absolute 0.7 0.2 - 1.0 K/uL   Eosinophils Relative 4 %   Eosinophils Absolute 0.4 0 - 0.7 K/uL   Basophils Relative 1 %   Basophils Absolute 0.1 0 - 0.1 K/uL    Physical Findings: AIMS: Facial and Oral Movements Muscles of Facial Expression: None, normal Lips and Perioral Area: None, normal Jaw: None, normal Tongue: None, normal,Extremity Movements Upper (arms, wrists, hands, fingers): None, normal Lower (legs, knees, ankles, toes): None, normal, Trunk Movements Neck, shoulders, hips: None, normal, Overall Severity Severity of abnormal movements (highest score from questions above): None, normal Incapacitation due to abnormal movements: None, normal Patient's awareness of abnormal movements (rate only patient's report): No Awareness, Dental Status Current problems with teeth and/or dentures?: No Does patient usually wear dentures?: No  CIWA:  CIWA-Ar Total: 0 COWS:     Treatment Plan Summary: Daily contact with patient to assess and evaluate symptoms and progress in treatment and Medication management   Medical Decision Making:  Established Problem, Worsening (2)   Depression-we'll continue Geodon. Patient is not interested in medication adjustments or additional medications. He declines treatment with any other antidepressants. I will change his dosing of Geodon from twice a day to once a day as he has been refusing twice a day dosing stating he only takes it once a day.  Substance Use-tox screen positive for stimulants. We will discuss options for  outpatient therapy and also group therapy within the milieu  Back pain-patient has Tylenol as needed however he declines pain medicine at this time.     Wayne Santiago 04/17/2015, 1:45 PM

## 2015-04-17 NOTE — [facility] Group Notes (Signed)
[facility] Group Notes:  (Nursing/MHT/Case Management/Adjunct)  Date:  04/17/2015  Time:  2:28 PM  Type of Therapy:  Group Therapy  Participation Level:  Did Not Attend  Summary of Progress/Problems:  Wayne Santiago De'Chelle Tamarick Kovalcik 04/17/2015, 2:28 PM

## 2015-04-18 MED ORDER — BENZTROPINE MESYLATE 1 MG PO TABS
1.0000 mg | ORAL_TABLET | Freq: Once | ORAL | Status: AC | PRN
  Administered 2015-04-18: 1 mg via ORAL
  Filled 2015-04-18: qty 1

## 2015-04-18 MED ORDER — DIPHENHYDRAMINE HCL 25 MG PO CAPS
50.0000 mg | ORAL_CAPSULE | Freq: Once | ORAL | Status: AC
  Administered 2015-04-18: 50 mg via ORAL
  Filled 2015-04-18: qty 2

## 2015-04-18 MED ORDER — BENZTROPINE MESYLATE 1 MG/ML IJ SOLN
2.0000 mg | Freq: Once | INTRAMUSCULAR | Status: AC
  Administered 2015-04-18: 2 mg via INTRAMUSCULAR
  Filled 2015-04-18: qty 2

## 2015-04-18 MED ORDER — NICOTINE 10 MG IN INHA
1.0000 | RESPIRATORY_TRACT | Status: DC | PRN
  Filled 2015-04-18: qty 36

## 2015-04-18 NOTE — Progress Notes (Signed)
[facility] MD Progress Note  04/18/2015 11:32 AM Wayne Santiago  MRN:  [id number] Subjective:  Patient does not appear to be interacting or attending groups or participating in treatment. Today I asked him about participating in groups today. He responded "today Sunday" and "I rest on Sunday." I still discussed with him that it appeared he had not been attending a lot of the groups or activities since he's been here. He stated he did go to 1 group yesterday. In regards to suicidal ideation he states it so longer there. Patient's is displaying limited interest in participating. Today is the first day he has verbalize not having suicidal ideation. Principal Problem: <principal problem not specified> Diagnosis:   Patient Active Problem List   Diagnosis Date Noted  . Bipolar disorder, now depressed [F31.30] 04/15/2015  . Bipolar 1 disorder, depressed [F31.9] 04/14/2015  . Cocaine abuse [F14.10] 04/14/2015   Total Time spent with patient: 15 minutes   Past Medical History:  Past Medical History  Diagnosis Date  . Hyperlipemia   . Gall stones    History reviewed. No pertinent past surgical history. Family History: History reviewed. No pertinent family history. Social History:  History  Alcohol Use No     History  Drug Use  . Yes  . Special: Cocaine    History   Social History  . Marital Status: Legally Separated    Spouse Name: N/A  . Number of Children: N/A  . Years of Education: N/A   Social History Main Topics  . Smoking status: Current Every Day Smoker -- 2.00 packs/day for 20 years    Types: Cigarettes  . Smokeless tobacco: Not on file  . Alcohol Use: No  . Drug Use: Yes    Special: Cocaine  . Sexual Activity: Not on file   Other Topics Concern  . None   Social History Narrative   Additional History:    Sleep: Good  Appetite:  Good   Assessment:   Musculoskeletal: Strength & Muscle Tone: within normal limits Gait & Station: normal Patient leans:  N/A   Psychiatric Specialty Exam: Physical Exam  ROS  Blood pressure 126/82, pulse 70, temperature 98 F (36.7 C), temperature source Oral, resp. rate 18, height  (1.88 m), weight 108.863 kg (240 lb), SpO2 98 %.Body mass index is 30.8 kg/(m^2).  General Appearance: Well Groomed  Patent attorney::  Fair  Speech:  Clear and Coherent and Normal Rate  Volume:  Normal  Mood:  Still depressed  Affect:  Constricted  Thought Process:  Linear and Logical  Orientation:  Full (Time, Place, and Person)  Thought Content:  Negative  Suicidal Thoughts:  No  Homicidal Thoughts:  No  Memory:  Immediate;   Good Recent;   Good Remote;   Good  Judgement:  Fair  Insight:  Fair  Psychomotor Activity:  Negative  Concentration:  Good  Recall:  Fair  Fund of Knowledge:Fair  Language: Fair  Akathisia:  Negative  Handed:  Right  AIMS (if indicated):     Assets:  Others:  Patient is not typically cooperative however history indicates some social supports in the past  ADL's:  Intact  Cognition: WNL  Sleep:  Number of Hours: 7     Current Medications: Current Facility-Administered Medications  Medication Dose Route Frequency Provider Last Rate Last Dose  . acetaminophen (TYLENOL) tablet 650 mg  650 mg Oral Q6H PRN Audery Amel, MD      . alum & mag hydroxide-simeth (MAALOX/MYLANTA) 200-200-20 MG/5ML  suspension 30 mL  30 mL Oral Q4H PRN Audery Amel, MD      . escitalopram (LEXAPRO) tablet 10 mg  10 mg Oral Daily Kerin Salen, MD   10 mg at 04/16/15 1215  . magnesium hydroxide (MILK OF MAGNESIA) suspension 30 mL  30 mL Oral Daily PRN Audery Amel, MD      . ziprasidone (GEODON) capsule 40 mg  40 mg Oral Daily Kerin Salen, MD   40 mg at 04/17/15 1710    Lab Results:  No results found for this or any previous visit (from the past 48 hour(s)).  Physical Findings: AIMS: Facial and Oral Movements Muscles of Facial Expression: None, normal Lips and Perioral Area: None, normal Jaw:  None, normal Tongue: None, normal,Extremity Movements Upper (arms, wrists, hands, fingers): None, normal Lower (legs, knees, ankles, toes): None, normal, Trunk Movements Neck, shoulders, hips: None, normal, Overall Severity Severity of abnormal movements (highest score from questions above): None, normal Incapacitation due to abnormal movements: None, normal Patient's awareness of abnormal movements (rate only patient's report): No Awareness, Dental Status Current problems with teeth and/or dentures?: No Does patient usually wear dentures?: No  CIWA:  CIWA-Ar Total: 0 COWS:     Treatment Plan Summary: Daily contact with patient to assess and evaluate symptoms and progress in treatment and Medication management   Medical Decision Making:  Established Problem, Worsening (2)   Depression-we'll continue Geodon. Patient is not interested in medication adjustments or additional medications. He declines treatment with any other antidepressants. Given he is now denying suicidal ideation and limited participation in the milieu assessment for discharge should begin.  Substance Use-tox screen positive for stimulants. We will discuss options for outpatient therapy and also group therapy within the milieu  Back pain-patient has Tylenol as needed however he declines pain medicine at this time.     Wayne Santiago 04/18/2015, 11:32 AM

## 2015-04-18 NOTE — Progress Notes (Signed)
Patient came to this writer requesting Cogentin for "tardive dyskinesia." When asked what symptoms he was experiencing, he said his jaw was stiff. Patient was able to speak without any problems, no evidence of any dystonias. Call placed to Dr. Mayford Knife and Benadryl was ordered.  Patient very upset that he could not get Cogentin. He started to shake one of his hands and said "look at this, I am having a reaction." Patient was told that Benadryl was prescribed and would assist with symptom reduction. Patient insisted that nurse call the physician back to he could speak to him personally and ask for Cogentin. Patient was told this was not an option, nurse would monitor response to prescribed Benadryl as ordered.  Dr. Virl Son was called a second time, case discussed with him and another nurse Toniann Fail, RN and decision made to continue with order as written for now.

## 2015-04-18 NOTE — Progress Notes (Signed)
Was in room resting at onset of shift. Asked to join evening group and have snack. Was agitated with any interation and remained in room entire shift.

## 2015-04-18 NOTE — Progress Notes (Addendum)
Patient spending shift in room except for meals. He will not attend any groups and refused his medication, saying he is "not on any medications."  Mood is euthymic.Denies SI. No evidence of psychotic thinking. Patient refusing any teaching regarding reasons for hospitalization and importance of compliance with treatment. Will encourage patient to participate in milieu and groups. Continue with current treatment plan, maintain safety precautions.

## 2015-04-18 NOTE — Progress Notes (Signed)
Patient reports decrease in side effects. No evidence of stiffness or shakiness.

## 2015-04-18 NOTE — B[facility] Counselor (Signed)
Adult Comprehensive Assessment  Patient ID: Wayne Santiago, male   DOB: [birth year], 44 y.o.   MRN: [id number]  Information Source: Information source: Patient  Current Stressors:  Educational / Learning stressors: None reported  Employment / Job issues: None reported  Family Relationships: None reported  Surveyor, quantity / Lack of resources (include bankruptcy): Limited income  Housing / Lack of housing: Pt is currently homeless.  Physical health (include injuries & life threatening diseases): None reported  Social relationships: None reported  Substance abuse: Pt reports relasping on Cocaine 2 weeks ago. Pt reports using $100 since relasping  Bereavement / Loss: None reported   Living/Environment/Situation:  Living Arrangements: Non-relatives/Friends Living conditions (as described by patient or guardian): Pt states he is homeless but is stays with friends.  How long has patient lived in current situation?: "a while"  What is atmosphere in current home: Temporary, Chaotic  Family History:  Marital status: Single Does patient have children?: No  Childhood History:  By whom was/is the patient raised?: Other (Comment) (Unwilling to answer ) Description of patient's relationship with caregiver when they were a child: Unwilling to answer  Patient's description of current relationship with people who raised him/her: Unwilling to answer  Does patient have siblings?:  (Unwilling to answer ) Did patient suffer from severe childhood neglect?:  (Unwilling to answer ) Has patient ever been sexually abused/assaulted/raped as an adolescent or adult?:  (Unwilling to answer ) Was the patient ever a victim of a crime or a disaster?:  (Unwilling to answer ) Witnessed domestic violence?:  (Unwilling to answer ) Has patient been effected by domestic violence as an adult?:  (Unwilling to answer )  Education:  Highest grade of school patient has completed: 12th Currently a student?: No Learning  disability?: No  Employment/Work Situation:   Employment situation: On disability Why is patient on disability: Bipolar  How long has patient been on disability: 20 years  Patient's job has been impacted by current illness: No What is the longest time patient has a held a job?: 7 years  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 Pt reports using around $100 since relapse.  Alcohol/Substance Abuse Treatment Hx: Relapse prevention program, Past detox If yes, describe treatment: Discharged from a hospital in [place] a 3 weeks ago. Stayed in an [facility] for one week prior to leaving.  Has alcohol/substance abuse ever caused legal problems?: No  Social Support System:   Forensic psychologist System: None Describe Community Support System: None   Type of faith/religion: Unwilling to answer  How does patient's faith help to cope with current illness?: Unwilling to answer   Leisure/Recreation:   Leisure and Hobbies: Unwilling to answer   Strengths/Needs:   What things does the patient do well?: Unwilling to answer  In what areas does patient struggle / problems for patient: Unwilling to answer   Discharge Plan:   Does patient have access to transportation?: Yes (Bus ) Will patient be returning to same living situation after discharge?: No Plan for living situation after discharge: Pt is interesting in  a group home or a substance abuse treatment program.  Currently receiving community mental health services: No If no, would patient like referral  for services when discharged?: No Does patient have financial barriers related to discharge medications?: Yes Patient description of barriers related to discharge medications: Limited income   Summary/Recommendations:   Wayne Santiago is a 44 year old male who presented to [facility] with depression and SI. Pt reports being discharged from a hospital in [place] 3 weeks ago. He was discharged to an [facility] in [place] but only stayed 1 week before relapsing on Cocaine. Pt states he has used around $100 of Cocaine since relapsing. During assessment, patient was irritable and guarded. He was unwilling to answer many questions.  Pt reports he is currently homeless. He has a history of Bipolar disorder. Pt is requesting group home placement or a substance abuse treatment program. He does not receive outpatient services at this time. Recommendations include; crisis stabilization, medication management, therapeutic milieu, and encouraging group attendance and participation.   Rondall Allegra, MSW, Theresia Majors  04/18/2015

## 2015-04-18 NOTE — [facility] Group Notes (Signed)
[facility] LCSW Group Therapy  04/18/2015 3:50 PM  Type of Therapy:  Group Therapy  Participation Level:  Minimal  Participation Quality:  Attentive  Affect:  Flat  Cognitive:  Alert  Insight:  Limited  Engagement in Therapy:  Limited  Modes of Intervention:  Discussion, Education, Role-play, Socialization and Support  Summary of Progress/Problems:Healthy Communication: Pt will discuss the importance of communication. They will be encouraged to share examples of unhealthy communication they have experienced. Also, they will be encouraged to provide strategies for healthy communication.  Wayne Santiago attended group and stayed the entire time. He sat quietly and listened to other group members.   Wayne Santiago L Wayne Santiago MSW, LCSWA  04/18/2015, 3:50 PM

## 2015-04-19 DIAGNOSIS — F1420 Cocaine dependence, uncomplicated: Secondary | ICD-10-CM | POA: Diagnosis present

## 2015-04-19 DIAGNOSIS — F3130 Bipolar disorder, current episode depressed, mild or moderate severity, unspecified: Secondary | ICD-10-CM

## 2015-04-19 DIAGNOSIS — F17200 Nicotine dependence, unspecified, uncomplicated: Secondary | ICD-10-CM | POA: Diagnosis present

## 2015-04-19 MED ORDER — BENZTROPINE MESYLATE 1 MG/ML IJ SOLN
2.0000 mg | Freq: Once | INTRAMUSCULAR | Status: AC
  Administered 2015-04-19: 2 mg via INTRAMUSCULAR
  Filled 2015-04-19 (×2): qty 2

## 2015-04-19 MED ORDER — BENZTROPINE MESYLATE 1 MG PO TABS: 1.0000 mg | ORAL_TABLET | Freq: Two times a day (BID) | ORAL | Status: DC

## 2015-04-19 MED ORDER — TRAZODONE HCL 100 MG PO TABS: 100.0000 mg | ORAL_TABLET | Freq: Every day | ORAL | Status: DC

## 2015-04-19 MED ORDER — ZIPRASIDONE HCL 40 MG PO CAPS: 40.0000 mg | ORAL_CAPSULE | Freq: Every day | ORAL | Status: DC

## 2015-04-19 NOTE — Progress Notes (Signed)
CSW met with Pt to review discharge plan. Pt is agreeable to follow up care at [facility]. Pt would prefer to go to [facility] today after discharge due to transportation concerns.  CSW and Pt reviewed Link transit information, route & schedule given to patient. Pt will also follow up with NA. He is familiar with the groups and reports benefiting from the support of a sponsor in the past. Pt plans to go to [facility]. Pt is not interested in returning to the [facility] in [place] due to "too many rules."   Pt verbalized that he is pleased with dc plan today.   Toma Copier, [place]

## 2015-04-19 NOTE — Plan of Care (Signed)
Problem: [facility] Participation in Recreation Therapeutic Interventions Goal: STG-Patient will demonstrate improved self esteem by identif STG: Self-Esteem - Within 5 treatment sessions, patient will verbalize at least 5 positive affirmation statements in each of 3 treatment sessions to increase self-esteem post d/c.  Outcome: Adequate for Discharge Treatment Session 2; Completed 2 out of 3: At approximately 11:35 am, LRT met with patient in patient room. Patient verbalized 5 positive affirmation statements. Patient reported it felt "good". LRT encouraged patient to continue saying the positive affirmation statements.  Leonette Monarch, LRT/CTRS 06.27.16 2:04 pm Goal: STG-Patient will identify at least five coping skills for ** STG: Coping Skills - Within 4 treatment sessions, patient will verbalize at least 5 coping skills for substance abuse in each of 2 treatment sessions to decrease substance abuse post d/c.  Outcome: Completed/Met Date Met:  04/19/15 Treatment Session 2; Completed 2 out of 2: At approximately 11:35 am, LRT met with patient in patient room. Patient verbalized 5 coping skills for substance abuse. LRT encouraged patient to use his coping skills.  Leonette Monarch, LRT/CTRS 06.27.16 2:05 pm

## 2015-04-19 NOTE — Progress Notes (Signed)
Patient denies SI/HI, denies A/V hallucinations. Patient verbalizes understanding of discharge instructions, follow up care and prescriptions.7 days meds given to patient. Patient given all belongings from  locker. Patient escorted out by staff.

## 2015-04-19 NOTE — B[facility] Suicide Risk Assessment (Signed)
[facility] INPATIENT:  Family/Significant Other Suicide Prevention Education  Suicide Prevention Education:  Patient Refusal for Family/Significant Other Suicide Prevention Education: The patient Wayne Santiago has refused to provide written consent for family/significant other to be provided Family/Significant Other Suicide Prevention Education during admission and/or prior to discharge.  Physician notified.  CSW reviewed suicide prevention education with patient.   Ned Card 04/19/2015, 11:36 AM

## 2015-04-19 NOTE — Progress Notes (Signed)
Recreation Therapy Notes  INPATIENT RECREATION TR PLAN  Patient Details Name: Wayne Santiago MRN: [id number] DOB: 11/08/1970 Today's Date: 04/19/2015  Rec Therapy Plan Is patient appropriate for Therapeutic Recreation?: Yes Treatment times per week: At least 3 times a week TR Treatment/Interventions: 1:1 session, Group participation (Comment) (Appropriate paricipation in daily recreation therapy tx)  Discharge Criteria Pt will be discharged from therapy if:: Discharged Treatment plan/goals/alternatives discussed and agreed upon by:: Patient/family  Discharge Summary Short term goals set: See Care Plan Short term goals met: Complete, Adequate for discharge Progress toward goals comments: One-to-one attended One-to-one attended: Self-esteem, coping skills Reason goals not met: Patient planning to d/c before goals could be met Therapeutic equipment acquired: None Reason patient discharged from therapy: Discharge from hospital Pt/family agrees with progress & goals achieved: Yes Date patient discharged from therapy: 04/19/15   Leonette Monarch, LRT/CTRS 04/19/2015, 2:07 PM

## 2015-04-19 NOTE — Progress Notes (Signed)
D: Patient denies SI/HI/AVH. Patient affect and mood are depressed.  Patient requested Cogentin for tremors.  Patient did attend evening group. Patient visible on the milieu. No distress noted. A: Support and encouragement offered. Scheduled medications given to pt. Q 15 min checks continued for patient safety. R: Patient receptive. Patient remains safe on the unit.

## 2015-04-19 NOTE — Progress Notes (Signed)
AVS H&P Discharge Summary faxed to [facility] for hospital follow-up °

## 2015-04-19 NOTE — Discharge Summary (Signed)
Physician Discharge Summary Note  Patient:  Wayne Santiago is an 44 y.o., male MRN:  [id number] DOB:  07-14-1971 Patient phone:  [phone] (home)  Patient address:   [address] [place] [address],  Total Time spent with patient: 30 minutes  Date of Admission:  04/15/2015 Date of Discharge: 04/19/2015  Reason for Admission:  Suicidal ideation.  History of Present Illness: Patient indicates that he has been off of his medications for approximately 8 months. He states he was taking Geodon 40 mg a day and Cogentin 1 mg twice a day. He is somewhat vague and guarded about his history. When asked how long he been on Geodon he still could state he cannot be sure but when pressed he stated it had been over a year. He states that his depression has been going on for about a month and his suicidal ideation is been occurring on and off for the past 2 months. When asked about neuro vegetative signs of depression he states his sleep has been good, his energy has been good and his appetite is been good. He states that he has had anhedonia and depressed mood.  Of note the patient relapsed into cocaine use over the past few days. He had been at a homeless shelter for the past few days after being thrown out of his most recent housing situation. Elements: Duration: Patient states that his depression has been going on for over a month. He states suicidal ideation has been off and on for the past 2 months.. Associated Signs/Symptoms: Depression Symptoms: depressed mood, anhedonia, (Hypo) Manic Symptoms: Denies any of these at this time Anxiety Symptoms: In eyes anxiety at this time Psychotic Symptoms: Hallucinations: Auditory PTSD Symptoms: denies at this time  Principal Problem: <principal problem not specified> Discharge Diagnoses: Patient Active Problem List   Diagnosis Date Noted  . Cocaine use disorder, severe, dependence [F14.20] 04/19/2015  . Tobacco use disorder, severe,  dependence [F17.200] 04/19/2015  . Bipolar disorder, now depressed [F31.30] 04/15/2015  . Bipolar 1 disorder, depressed [F31.9] 04/14/2015  . Cocaine abuse [F14.10] 04/14/2015    Musculoskeletal: Strength & Muscle Tone: within normal limits Gait & Station: normal Patient leans: N/A  Psychiatric Specialty Exam: Physical Exam  Nursing note and vitals reviewed.   Review of Systems  Musculoskeletal: Positive for joint pain.  All other systems reviewed and are negative.   Blood pressure 112/80, pulse 70, temperature 98.1 F (36.7 C), temperature source Oral, resp. rate 18, height  (1.88 m), weight 108.863 kg (240 lb), SpO2 98 %.Body mass index is 30.8 kg/(m^2).  See SRA.                                                  Sleep:  Number of Hours: 1.25   Have you used any form of tobacco in the last 30 days? (Cigarettes, Smokeless Tobacco, Cigars, and/or Pipes): Yes  Has this patient used any form of tobacco in the last 30 days? (Cigarettes, Smokeless Tobacco, Cigars, and/or Pipes) Yes, A prescription for an FDA-approved tobacco cessation medication was offered at discharge and the patient refused  Past Medical History:  Past Medical History  Diagnosis Date  . Hyperlipemia   . Gall stones    History reviewed. No pertinent past surgical history. Family History: History reviewed. No pertinent family history. Social History:  History  Alcohol Use No     History  Drug Use  . Yes  . Special: Cocaine    History   Social History  . Marital Status: Legally Separated    Spouse Name: N/A  . Number of Children: N/A  . Years of Education: N/A   Social History Main Topics  . Smoking status: Current Every Day Smoker -- 2.00 packs/day for 20 years    Types: Cigarettes  . Smokeless tobacco: Not on file  . Alcohol Use: No  . Drug Use: Yes    Special: Cocaine  . Sexual Activity: Not on file   Other Topics Concern  . None   Social History Narrative     Past Psychiatric History: Hospitalizations:  Outpatient Care:  Substance Abuse Care:  Self-Mutilation:  Suicidal Attempts:  Violent Behaviors:   Risk to Self: Is patient at risk for suicide?: Yes What has been your use of drugs/alcohol within the last 12 months?: Pt relasped on Cocaine 2 weeks ago. Pt reports using around $100 since relapse.  Risk to Others:   Prior Inpatient Therapy:   Prior Outpatient Therapy:    Level of Care:  OP  Hospital Course:   Mr. Mayford KnifeWilliams is a 44 year old male with a history of bipolar disorder and cocaine dependence admitted to the hospital for suicidal thinking in the context of treatment noncompliance, relapse on cocaine, and loss of housing.  1 suicidal ideation. This has resolved. The patient is able to contract for safety.  2. Mood. The patient will continue Geodon 40 mg at bedtime with food. This was the dose that was effective before. While in the hospital he experienced some stiffness in his job and was given Cogentin.  3. Substance Use. The patient is not interested in residential treatment. He wants to try intensive outpatient program along with AA.  4. Smoking. Nicotine products were available.   5. Back pain. This was addressed with nonnarcotic medication.  6. Disposition. He will will be discharged to the homeless shelter. He will follow up with [facility] for medication management and IOP program.  Consults:  None  Significant Diagnostic Studies:  None  Discharge Vitals:   Blood pressure 112/80, pulse 70, temperature 98.1 F (36.7 C), temperature source Oral, resp. rate 18, height 6\' 2"  (1.88 m), weight 108.863 kg (240 lb), SpO2 98 %. Body mass index is 30.8 kg/(m^2). Lab Results:   No results found for this or any previous visit (from the past 72 hour(s)).  Physical Findings: AIMS: Facial and Oral Movements Muscles of Facial Expression: None, normal Lips and Perioral Area: None, normal Jaw: None, normal Tongue: None,  normal,Extremity Movements Upper (arms, wrists, hands, fingers): None, normal Lower (legs, knees, ankles, toes): None, normal, Trunk Movements Neck, shoulders, hips: None, normal, Overall Severity Severity of abnormal movements (highest score from questions above): None, normal Incapacitation due to abnormal movements: None, normal Patient's awareness of abnormal movements (rate only patient's report): No Awareness, Dental Status Current problems with teeth and/or dentures?: No Does patient usually wear dentures?: No  CIWA:  CIWA-Ar Total: 0 COWS:      See Psychiatric Specialty Exam and Suicide Risk Assessment completed by Attending Physician prior to discharge.  Discharge destination:  Other:  Homeless shelter  Is patient on multiple antipsychotic therapies at discharge:  No   Has Patient had three or more failed trials of antipsychotic monotherapy by history:  No    Recommended Plan for Multiple Antipsychotic Therapies: NA  Discharge Instructions    Diet -  low sodium heart healthy    Complete by:  As directed      Increase activity slowly    Complete by:  As directed             Medication List    TAKE these medications      Indication   benztropine 1 MG tablet  Commonly known as:  COGENTIN  Take 1 tablet (1 mg total) by mouth 2 (two) times daily.   Indication:  EPS Prophylasix     ziprasidone 40 MG capsule  Commonly known as:  GEODON  Take 1 capsule (40 mg total) by mouth at bedtime.   Indication:  Schizophrenia         Follow-up recommendations:  Activity:  As tolerated. Diet:  Low sodium heart healthy. Other:  Keep follow-up appointments.  Comments:    Total Discharge Time: 35 min.  Signed: Asbury Hair 04/19/2015, 10:18 AM

## 2015-04-19 NOTE — B[facility] Group Notes (Signed)
[facility] Group Notes:  (Nursing/MHT/Case Management/Adjunct)  Date:  04/19/2015  Time:  5:02 AM  Type of Therapy:  Evening Wrap-up Group/Outside  Participation Level:  Active  Participation Quality:  Appropriate  Affect:  Appropriate  Cognitive:  Alert  Insight:  Good  Engagement in Group:  Improving  Modes of Intervention:  Activity  Summary of Progress/Problems:  Tomasita Morrow 04/19/2015, 5:02 AM

## 2015-04-19 NOTE — BHH Suicide Risk Assessment (Signed)
[facility] Discharge Suicide Risk Assessment   Demographic Factors:  Male, Caucasian and Low socioeconomic status  Total Time spent with patient: 30 minutes  Musculoskeletal: Strength & Muscle Tone: within normal limits Gait & Station: normal Patient leans: N/A  Psychiatric Specialty Exam: Physical Exam  Nursing note and vitals reviewed.   Review of Systems  Musculoskeletal: Positive for joint pain.  All other systems reviewed and are negative.   Blood pressure 112/80, pulse 70, temperature 98.1 F (36.7 C), temperature source Oral, resp. rate 18, height 6\' 2"  (1.88 m), weight 108.863 kg (240 lb), SpO2 98 %.Body mass index is 30.8 kg/(m^2).  General Appearance: Casual  Eye Contact::  Good  Speech:  Clear and Coherent409  Volume:  Normal  Mood:  Euthymic  Affect:  Appropriate  Thought Process:  Goal Directed  Orientation:  Full (Time, Place, and Person)  Thought Content:  WDL  Suicidal Thoughts:  No  Homicidal Thoughts:  No  Memory:  Immediate;   Fair Recent;   Fair Remote;   Fair  Judgement:  Fair  Insight:  Fair  Psychomotor Activity:  Normal  Concentration:  Fair  Recall:  [employer] of Knowledge:Fair  Language: Fair  Akathisia:  No  Handed:  Right  AIMS (if indicated):     Assets:  Communication Skills Desire for Improvement Financial Resources/Insurance Physical Health Resilience Social Support  Sleep:  Number of Hours: 1.25  Cognition: WNL  ADL's:  Intact   Have you used any form of tobacco in the last 30 days? (Cigarettes, Smokeless Tobacco, Cigars, and/or Pipes): Yes  Has this patient used any form of tobacco in the last 30 days? (Cigarettes, Smokeless Tobacco, Cigars, and/or Pipes) Yes, A prescription for an FDA-approved tobacco cessation medication was offered at discharge and the patient refused  Mental Status Per Nursing Assessment::   On Admission:     Current Mental Status by Physician: NA  Loss Factors: Financial problems/change in  socioeconomic status  Historical Factors: Impulsivity  Risk Reduction Factors:   Sense of responsibility to family  Continued Clinical Symptoms:  Bipolar Disorder:   Depressive phase Alcohol/Substance Abuse/Dependencies  Cognitive Features That Contribute To Risk:  None    Suicide Risk:  Minimal: No identifiable suicidal ideation.  Patients presenting with no risk factors but with morbid ruminations; may be classified as minimal risk based on the severity of the depressive symptoms  Principal Problem: <principal problem not specified> Discharge Diagnoses:  Patient Active Problem List   Diagnosis Date Noted  . Cocaine use disorder, severe, dependence [F14.20] 04/19/2015  . Tobacco use disorder, severe, dependence [F17.200] 04/19/2015  . Bipolar disorder, now depressed [F31.30] 04/15/2015  . Bipolar 1 disorder, depressed [F31.9] 04/14/2015  . Cocaine abuse [F14.10] 04/14/2015      Plan Of Care/Follow-up recommendations:  Activity:  as tolerated. Diet:  Low sodium heart healthy. Other:  keep follow ujp appointments.  Is patient on multiple antipsychotic therapies at discharge:  No   Has Patient had three or more failed trials of antipsychotic monotherapy by history:  No  Recommended Plan for Multiple Antipsychotic Therapies: NA    Lindsea Olivar 04/19/2015, 10:13 AM

## 2015-05-16 ENCOUNTER — Encounter: Payer: Self-pay | Admitting: Emergency Medicine

## 2015-05-16 ENCOUNTER — Emergency Department
Admission: EM | Admit: 2015-05-16 | Discharge: 2015-05-16 | Discharge status: Against Medical Advice | Payer: Medicaid Other | Attending: Emergency Medicine | Admitting: Emergency Medicine

## 2015-05-16 ENCOUNTER — Other Ambulatory Visit: Payer: Self-pay

## 2015-05-16 DIAGNOSIS — Z720 Tobacco use: Secondary | ICD-10-CM | POA: Diagnosis not present

## 2015-05-16 DIAGNOSIS — Z8719 Personal history of other diseases of the digestive system: Secondary | ICD-10-CM | POA: Diagnosis not present

## 2015-05-16 DIAGNOSIS — R1011 Right upper quadrant pain: Secondary | ICD-10-CM | POA: Diagnosis present

## 2015-05-16 LAB — CBC WITH DIFFERENTIAL/PLATELET
BASOS ABS: 0.1 10*3/uL (ref 0–0.1)
Basophils Relative: 1 %
Eosinophils Absolute: 0.3 10*3/uL (ref 0–0.7)
Eosinophils Relative: 3 %
HCT: 50 % (ref 40.0–52.0)
HEMOGLOBIN: 16.7 g/dL (ref 13.0–18.0)
LYMPHS PCT: 22 %
Lymphs Abs: 2.5 10*3/uL (ref 1.0–3.6)
MCH: 29.3 pg (ref 26.0–34.0)
MCHC: 33.4 g/dL (ref 32.0–36.0)
MCV: 87.7 fL (ref 80.0–100.0)
Monocytes Absolute: 0.8 10*3/uL (ref 0.2–1.0)
Monocytes Relative: 7 %
NEUTROS ABS: 7.5 10*3/uL — AB (ref 1.4–6.5)
Neutrophils Relative %: 67 %
Platelets: 271 10*3/uL (ref 150–440)
RBC: 5.7 MIL/uL (ref 4.40–5.90)
RDW: 13.7 % (ref 11.5–14.5)
WBC: 11.1 10*3/uL — ABNORMAL HIGH (ref 3.8–10.6)

## 2015-05-16 LAB — COMPREHENSIVE METABOLIC PANEL
ALBUMIN: 4.3 g/dL (ref 3.5–5.0)
ALK PHOS: 61 U/L (ref 38–126)
ALT: 30 U/L (ref 17–63)
AST: 38 U/L (ref 15–41)
Anion gap: 6 (ref 5–15)
BUN: 6 mg/dL (ref 6–20)
CO2: 23 mmol/L (ref 22–32)
Calcium: 8.5 mg/dL — ABNORMAL LOW (ref 8.9–10.3)
Chloride: 109 mmol/L (ref 101–111)
Creatinine, Ser: 0.76 mg/dL (ref 0.61–1.24)
GFR calc Af Amer: >60 mL/min (ref >60)
GFR calc non Af Amer: >60 mL/min (ref >60)
Glucose, Bld: 107 mg/dL — ABNORMAL HIGH (ref 65–99)
POTASSIUM: 3.7 mmol/L (ref 3.5–5.1)
Sodium: 138 mmol/L (ref 135–145)
Total Bilirubin: 0.2 mg/dL — ABNORMAL LOW (ref 0.3–1.2)
Total Protein: 6.9 g/dL (ref 6.5–8.1)

## 2015-05-16 LAB — URINALYSIS COMPLETE WITH MICROSCOPIC (ARMC ONLY)
BACTERIA UA: NONE SEEN
BILIRUBIN URINE: NEGATIVE
GLUCOSE, UA: NEGATIVE mg/dL
HGB URINE DIPSTICK: NEGATIVE
Ketones, ur: NEGATIVE mg/dL
Leukocytes, UA: NEGATIVE
Nitrite: NEGATIVE
PH: 6 (ref 5.0–8.0)
Protein, ur: NEGATIVE mg/dL
SPECIFIC GRAVITY, URINE: 1.014 (ref 1.005–1.030)

## 2015-05-16 LAB — LIPASE, BLOOD: Lipase: 60 U/L — ABNORMAL HIGH (ref 22–51)

## 2015-05-16 NOTE — ED Notes (Signed)
Attempted to get patient to come back to his room. Pt again states he is feeling better and that he just wants to see his own doctor. Charge nurse notified pt unwilling to come back. Pt states his ride is 20 mins out.

## 2015-05-16 NOTE — ED Notes (Signed)
Pt drinking coke at this time, asking registration where cafeteria is so he can get breakfast.

## 2015-05-16 NOTE — ED Notes (Signed)
Pt presents to ER alert and in NAD via EMS. Pt reports he has hx of gallstones and is having RUQ abd pain.

## 2015-05-16 NOTE — ED Notes (Signed)
Pt found sitting outside smoking a cigarette. Explained a room is ready for him. Pt states he is ready to leave and does not wish to be seen. Explained he is only waiting to be seen by MD because lab work and u/a is completed. He states he knows he needs a Careers adviser and that we won't do much for him. States he doesn't wish to be seen and wants to leave. Charge nurse notified.

## 2015-05-22 ENCOUNTER — Emergency Department
Admission: EM | Admit: 2015-05-22 | Discharge: 2015-05-22 | Discharge status: Against Medical Advice | Payer: Medicaid Other | Attending: Emergency Medicine | Admitting: Emergency Medicine

## 2015-05-22 DIAGNOSIS — R112 Nausea with vomiting, unspecified: Secondary | ICD-10-CM | POA: Diagnosis not present

## 2015-05-22 DIAGNOSIS — Z79899 Other long term (current) drug therapy: Secondary | ICD-10-CM | POA: Insufficient documentation

## 2015-05-22 DIAGNOSIS — Z880 Allergy status to penicillin: Secondary | ICD-10-CM | POA: Insufficient documentation

## 2015-05-22 DIAGNOSIS — F419 Anxiety disorder, unspecified: Secondary | ICD-10-CM | POA: Insufficient documentation

## 2015-05-22 DIAGNOSIS — Z87828 Personal history of other (healed) physical injury and trauma: Secondary | ICD-10-CM | POA: Insufficient documentation

## 2015-05-22 DIAGNOSIS — Z720 Tobacco use: Secondary | ICD-10-CM | POA: Insufficient documentation

## 2015-05-22 DIAGNOSIS — R1011 Right upper quadrant pain: Secondary | ICD-10-CM | POA: Diagnosis not present

## 2015-05-22 HISTORY — DX: Accidental discharge from unspecified firearms or gun, initial encounter: W34.00XA

## 2015-05-22 HISTORY — DX: Bipolar disorder, unspecified: F31.9

## 2015-05-22 HISTORY — DX: Puncture wound without foreign body, unspecified thigh, initial encounter: S71.139A

## 2015-05-22 HISTORY — DX: Post-traumatic stress disorder, unspecified: F43.10

## 2015-05-22 LAB — COMPREHENSIVE METABOLIC PANEL
ALT: 42 U/L (ref 17–63)
AST: 33 U/L (ref 15–41)
Albumin: 4.5 g/dL (ref 3.5–5.0)
Alkaline Phosphatase: 71 U/L (ref 38–126)
Anion gap: 12 (ref 5–15)
BUN: 6 mg/dL (ref 6–20)
CO2: 23 mmol/L (ref 22–32)
CREATININE: 0.79 mg/dL (ref 0.61–1.24)
Calcium: 9.2 mg/dL (ref 8.9–10.3)
Chloride: 102 mmol/L (ref 101–111)
GFR calc Af Amer: >60 mL/min (ref >60)
GFR calc non Af Amer: >60 mL/min (ref >60)
Glucose, Bld: 127 mg/dL — ABNORMAL HIGH (ref 65–99)
Potassium: 3 mmol/L — ABNORMAL LOW (ref 3.5–5.1)
Sodium: 137 mmol/L (ref 135–145)
Total Bilirubin: 0.5 mg/dL (ref 0.3–1.2)
Total Protein: 7.5 g/dL (ref 6.5–8.1)

## 2015-05-22 LAB — URINALYSIS COMPLETE WITH MICROSCOPIC (ARMC ONLY)
Bacteria, UA: NONE SEEN
Bilirubin Urine: NEGATIVE
Glucose, UA: NEGATIVE mg/dL
Hgb urine dipstick: NEGATIVE
Ketones, ur: NEGATIVE mg/dL
LEUKOCYTES UA: NEGATIVE
Nitrite: NEGATIVE
PH: 6 (ref 5.0–8.0)
PROTEIN: NEGATIVE mg/dL
Specific Gravity, Urine: 1.012 (ref 1.005–1.030)
Squamous Epithelial / LPF: NONE SEEN

## 2015-05-22 LAB — CBC
HCT: 52.9 % — ABNORMAL HIGH (ref 40.0–52.0)
Hemoglobin: 17.8 g/dL (ref 13.0–18.0)
MCH: 29.3 pg (ref 26.0–34.0)
MCHC: 33.7 g/dL (ref 32.0–36.0)
MCV: 87.1 fL (ref 80.0–100.0)
Platelets: 276 10*3/uL (ref 150–440)
RBC: 6.08 MIL/uL — ABNORMAL HIGH (ref 4.40–5.90)
RDW: 13.4 % (ref 11.5–14.5)
WBC: 11.2 10*3/uL — AB (ref 3.8–10.6)

## 2015-05-22 LAB — LIPASE, BLOOD: Lipase: 78 U/L — ABNORMAL HIGH (ref 22–51)

## 2015-05-22 MED ORDER — KETOROLAC TROMETHAMINE 30 MG/ML IJ SOLN
30.0000 mg | Freq: Once | INTRAMUSCULAR | Status: AC
  Administered 2015-05-22: 30 mg via INTRAVENOUS
  Filled 2015-05-22: qty 1

## 2015-05-22 MED ORDER — FAMOTIDINE 20 MG PO TABS
40.0000 mg | ORAL_TABLET | Freq: Once | ORAL | Status: AC
  Administered 2015-05-22: 40 mg via ORAL
  Filled 2015-05-22: qty 2

## 2015-05-22 MED ORDER — IOHEXOL 240 MG/ML SOLN
25.0000 mL | Freq: Once | INTRAMUSCULAR | Status: AC | PRN
  Administered 2015-05-22: 25 mL via ORAL

## 2015-05-22 MED ORDER — POTASSIUM CHLORIDE CRYS ER 20 MEQ PO TBCR
40.0000 meq | EXTENDED_RELEASE_TABLET | Freq: Once | ORAL | Status: AC
  Administered 2015-05-22: 40 meq via ORAL
  Filled 2015-05-22: qty 2
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
## 2015-05-22 NOTE — Discharge Instructions (Signed)
Please come back to the emergency room today as soon as possible to continue your evaluation to rule out life-threatening causes of your abdominal pain. We are sending her home against are strong advice to stay for evaluation to make sure you do not have a severe infection, bowel rupture, intestine obstruction or other cause of your severe pain.  Abdominal Pain Many things can cause abdominal pain. Usually, abdominal pain is not caused by a disease and will improve without treatment. It can often be observed and treated at home. Your health care provider will do a physical exam and possibly order blood tests and X-rays to help determine the seriousness of your pain. However, in many cases, more time must pass before a clear cause of the pain can be found. Before that point, your health care provider may not know if you need more testing or further treatment. HOME CARE INSTRUCTIONS  Monitor your abdominal pain for any changes. The following actions may help to alleviate any discomfort you are experiencing:  Only take over-the-counter or prescription medicines as directed by your health care provider.  Do not take laxatives unless directed to do so by your health care provider.  Try a clear liquid diet (broth, tea, or water) as directed by your health care provider. Slowly move to a bland diet as tolerated. SEEK MEDICAL CARE IF:  You have unexplained abdominal pain.  You have abdominal pain associated with nausea or diarrhea.  You have pain when you urinate or have a bowel movement.  You experience abdominal pain that wakes you in the night.  You have abdominal pain that is worsened or improved by eating food.  You have abdominal pain that is worsened with eating fatty foods.  You have a fever. SEEK IMMEDIATE MEDICAL CARE IF:   Your pain does not go away within 2 hours.  You keep throwing up (vomiting).  Your pain is felt only in portions of the abdomen, such as the right side or the  left lower portion of the abdomen.  You pass bloody or black tarry stools. MAKE SURE YOU:  Understand these instructions.   Will watch your condition.   Will get help right away if you are not doing well or get worse.  Document Released: 07/19/2005 Document Revised: 10/14/2013 Document Reviewed: 06/18/2013 [facility] Patient Information 2015 [place], [place]. This information is not intended to replace advice given to you by your health care provider. Make sure you discuss any questions you have with your health care provider.

## 2015-05-22 NOTE — ED Notes (Signed)
Pt states he has been having issues with gall stones for over a year and just moved to [place] 3months ago, states he had an episode 2-3 weeks ago and was seen here. Today pain returned with N/V.Marland Kitchen

## 2015-05-22 NOTE — ED Notes (Signed)
Extra pillows and blankets provided to pt per request. Call bell at side.

## 2022-04-19 ENCOUNTER — Inpatient Hospital Stay
Admission: AD | Admit: 2022-04-19 | Discharge: 2022-04-26 | DRG: 885 | Disposition: A | Discharge status: Home / Self Care | Payer: Medicare Other | Source: Intra-hospital | Attending: Psychiatry | Admitting: Psychiatry

## 2022-04-19 ENCOUNTER — Other Ambulatory Visit: Payer: Self-pay

## 2022-04-19 ENCOUNTER — Emergency Department
Admission: EM | Admit: 2022-04-19 | Discharge: 2022-04-19 | Disposition: A | Discharge status: Home / Self Care | Payer: Medicare Other | Source: Home / Self Care | Attending: Emergency Medicine | Admitting: Emergency Medicine

## 2022-04-19 ENCOUNTER — Emergency Department (EMERGENCY_DEPARTMENT_HOSPITAL)
Admission: EM | Admit: 2022-04-19 | Discharge: 2022-04-19 | Disposition: A | Discharge status: Other Acute Inpatient Hospital | Payer: Medicare Other | Source: Home / Self Care | Attending: Emergency Medicine | Admitting: Emergency Medicine

## 2022-04-19 DIAGNOSIS — Z91148 Patient's other noncompliance with medication regimen for other reason: Secondary | ICD-10-CM | POA: Diagnosis not present

## 2022-04-19 DIAGNOSIS — F319 Bipolar disorder, unspecified: Principal | ICD-10-CM | POA: Diagnosis present

## 2022-04-19 DIAGNOSIS — Z20822 Contact with and (suspected) exposure to covid-19: Secondary | ICD-10-CM | POA: Insufficient documentation

## 2022-04-19 DIAGNOSIS — F29 Unspecified psychosis not due to a substance or known physiological condition: Secondary | ICD-10-CM | POA: Insufficient documentation

## 2022-04-19 DIAGNOSIS — G4700 Insomnia, unspecified: Secondary | ICD-10-CM | POA: Diagnosis present

## 2022-04-19 DIAGNOSIS — Z5900 Homelessness unspecified: Secondary | ICD-10-CM | POA: Insufficient documentation

## 2022-04-19 DIAGNOSIS — F4310 Post-traumatic stress disorder, unspecified: Secondary | ICD-10-CM | POA: Diagnosis present

## 2022-04-19 DIAGNOSIS — Z880 Allergy status to penicillin: Secondary | ICD-10-CM

## 2022-04-19 DIAGNOSIS — F17210 Nicotine dependence, cigarettes, uncomplicated: Secondary | ICD-10-CM | POA: Diagnosis present

## 2022-04-19 DIAGNOSIS — F3130 Bipolar disorder, current episode depressed, mild or moderate severity, unspecified: Secondary | ICD-10-CM | POA: Insufficient documentation

## 2022-04-19 DIAGNOSIS — Z79899 Other long term (current) drug therapy: Secondary | ICD-10-CM | POA: Insufficient documentation

## 2022-04-19 DIAGNOSIS — F17200 Nicotine dependence, unspecified, uncomplicated: Secondary | ICD-10-CM | POA: Insufficient documentation

## 2022-04-19 DIAGNOSIS — F259 Schizoaffective disorder, unspecified: Secondary | ICD-10-CM | POA: Diagnosis present

## 2022-04-19 DIAGNOSIS — E785 Hyperlipidemia, unspecified: Secondary | ICD-10-CM | POA: Diagnosis present

## 2022-04-19 LAB — COMPREHENSIVE METABOLIC PANEL
ALT: 30 U/L (ref 0–44)
AST: 27 U/L (ref 15–41)
Albumin: 4.5 g/dL (ref 3.5–5.0)
Alkaline Phosphatase: 81 U/L (ref 38–126)
Anion gap: 8 (ref 5–15)
BUN: 14 mg/dL (ref 6–20)
CO2: 31 mmol/L (ref 22–32)
Calcium: 9.2 mg/dL (ref 8.9–10.3)
Chloride: 101 mmol/L (ref 98–111)
Creatinine, Ser: 0.74 mg/dL (ref 0.61–1.24)
GFR, Estimated: >60 mL/min (ref >60)
Glucose, Bld: 94 mg/dL (ref 70–99)
Potassium: 3.3 mmol/L — ABNORMAL LOW (ref 3.5–5.1)
Sodium: 140 mmol/L (ref 135–145)
Total Bilirubin: 1.1 mg/dL (ref 0.3–1.2)
Total Protein: 7.6 g/dL (ref 6.5–8.1)

## 2022-04-19 LAB — URINE DRUG SCREEN, QUALITATIVE (ARMC ONLY)
Amphetamines, Ur Screen: NOT DETECTED
Barbiturates, Ur Screen: NOT DETECTED
Benzodiazepine, Ur Scrn: NOT DETECTED
Cannabinoid 50 Ng, Ur ~~LOC~~: NOT DETECTED
Cocaine Metabolite,Ur ~~LOC~~: NOT DETECTED
MDMA (Ecstasy)Ur Screen: NOT DETECTED
Methadone Scn, Ur: NOT DETECTED
Opiate, Ur Screen: NOT DETECTED
Phencyclidine (PCP) Ur S: NOT DETECTED
Tricyclic, Ur Screen: NOT DETECTED

## 2022-04-19 LAB — CBC
HCT: 46.7 % (ref 39.0–52.0)
Hemoglobin: 15.6 g/dL (ref 13.0–17.0)
MCH: 29.5 pg (ref 26.0–34.0)
MCHC: 33.4 g/dL (ref 30.0–36.0)
MCV: 88.3 fL (ref 80.0–100.0)
Platelets: 217 10*3/uL (ref 150–400)
RBC: 5.29 MIL/uL (ref 4.22–5.81)
RDW: 12.4 % (ref 11.5–15.5)
WBC: 11.3 10*3/uL — ABNORMAL HIGH (ref 4.0–10.5)
nRBC: 0 % (ref 0.0–0.2)

## 2022-04-19 LAB — SALICYLATE LEVEL: Salicylate Lvl: <7 mg/dL — ABNORMAL LOW (ref 7.0–30.0)

## 2022-04-19 LAB — RESP PANEL BY RT-PCR (FLU A&B, COVID) ARPGX2
Influenza A by PCR: NEGATIVE
Influenza B by PCR: NEGATIVE
SARS Coronavirus 2 by RT PCR: NEGATIVE

## 2022-04-19 LAB — ETHANOL: Alcohol, Ethyl (B): <10 mg/dL (ref <10)

## 2022-04-19 LAB — ACETAMINOPHEN LEVEL: Acetaminophen (Tylenol), Serum: <10 ug/mL — ABNORMAL LOW (ref 10–30)

## 2022-04-19 MED ORDER — ALUM & MAG HYDROXIDE-SIMETH 200-200-20 MG/5ML PO SUSP: 30.0000 mL | ORAL | Status: DC | PRN

## 2022-04-19 MED ORDER — TRAZODONE HCL 100 MG PO TABS
100.0000 mg | ORAL_TABLET | Freq: Every day | ORAL | Status: DC
  Administered 2022-04-23: 100 mg via ORAL
  Filled 2022-04-19 (×2): qty 1

## 2022-04-19 MED ORDER — MAGNESIUM HYDROXIDE 400 MG/5ML PO SUSP: 30.0000 mL | Freq: Every day | ORAL | Status: DC | PRN

## 2022-04-19 MED ORDER — ACETAMINOPHEN 325 MG PO TABS: 650.0000 mg | ORAL_TABLET | Freq: Four times a day (QID) | ORAL | Status: DC | PRN

## 2022-04-19 NOTE — ED Notes (Signed)
Pt wearing his underpants after changing out into burgundy scrubs. Pt told he had to remove them. Pt seen throwing underpants away.

## 2022-04-19 NOTE — ED Notes (Addendum)
Patient transferred from ED to [facility] room 3 after screening for contraband. Report received from Selena Batten, RN including Situation, Background, Assessment and Recommendations. Pt oriented to unit including Q15 minute rounds as well as the security cameras for their protection. Patient is alert and oriented, warm and dry in no acute distress. Patient denies SI, HI, and AVH. Pt. Encouraged to let this nurse know if needs arise.   Provided with snack when arrives to unit
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
## 2022-04-19 NOTE — BH Assessment (Signed)
Comprehensive Clinical Assessment (CCA) Note  04/19/2022 Wayne Santiago [id number]  Chief Complaint: Patient is a 51 year old male presenting to [facility] ED under IVC. Per triage note Pt is ivc a this time and brought by [place] [facility]. Pt has been to the ER once prior today but left without treatment, pt has a hx of bipolar and states that he hasn't been on his medications, it was reported that he had bizarre behavior at the store. Pt is sleepy at this time but denies drinking alcohol or using street drugs, states that he has slept well in the past 4 days and doesn't have a place to stay at this time, pt smells strong of body odor. Patient did present to this ED earlier after he was caught stealing from a gas station and he reported to police that he believed he was poisoned. During assessment patient appears alert and oriented x2, calm and cooperative. When asked if patient understands why he is in the ED his speech is garbled and has some disorganized thoughts making it difficult for the psyc team to understand, patient was able to report "stress." When asked if he is taking any medications he reports "they haven't told me." When asked if he is experiencing any AH he denies, when asked about VH he does not respond, when asked about SI he reports "I don't want to answer that."   Per Psyc NP Elenore Paddy patient is recommended for Inpatient Chief Complaint  Patient presents with   Mental Health Problem   Visit Diagnosis: Bipolar 1 disorder    CCA Screening, Triage and Referral (STR)  Patient Reported Information How did you hear about [place]? Legal System  Referral name: No data recorded Referral phone number: No data recorded  Whom do you see for routine medical problems? No data recorded Practice/Facility Name: No data recorded Practice/Facility Phone Number: No data recorded Name of Contact: No data recorded Contact Number: No data recorded Contact Fax Number: No data recorded Prescriber Name:  No data recorded Prescriber Address (if known): No data recorded  What Is the Reason for Your Visit/Call Today? Patient presents under IVC ude to bizarre behavior  How Long Has This Been Causing You Problems? > than 6 months  What Do You Feel Would Help You the Most Today? No data recorded  Have You Recently Been in Any Inpatient Treatment (Hospital/Detox/Crisis Center/28-Day Program)? No data recorded Name/Location of Program/Hospital:No data recorded How Long Were You There? No data recorded When Were You Discharged? No data recorded  Have You Ever Received Services From [facility] Before? No data recorded Who Do You See at [facility]? No data recorded  Have You Recently Had Any Thoughts About Hurting Yourself? -- (UTA)  Are You Planning to Commit Suicide/Harm Yourself At This time? -- (UTA)   Have you Recently Had Thoughts About Hurting Someone Else? -- (UTA)  Explanation: No data recorded  Have You Used Any Alcohol or Drugs in the Past 24 Hours? No  How Long Ago Did You Use Drugs or Alcohol? No data recorded What Did You Use and How Much? No data recorded  Do You Currently Have a Therapist/Psychiatrist? -- (UTA)  Name of Therapist/Psychiatrist: No data recorded  Have You Been Recently Discharged From Any Office Practice or Programs? -- (UTA)  Explanation of Discharge From Practice/Program: No data recorded    CCA Screening Triage Referral Assessment Type of Contact: Face-to-Face  Is this Initial or Reassessment? No data recorded Date Telepsych consult ordered in  [facility]:  No data recorded Time Telepsych consult ordered in [facility]:  No data recorded  Patient Reported Information Reviewed? No data recorded Patient Left Without Being Seen? No data recorded Reason for Not Completing Assessment: No data recorded  Collateral Involvement: No data recorded  Does Patient Have a Court Appointed Legal Guardian? No data recorded Name and Contact of Legal Guardian: No data  recorded If Minor and Not Living with Parent(s), Who has Custody? No data recorded Is CPS involved or ever been involved? Never  Is APS involved or ever been involved? Never   Patient Determined To Be At Risk for Harm To Self or Others Based on Review of Patient Reported Information or Presenting Complaint? No  Method: No data recorded Availability of Means: No data recorded Intent: No data recorded Notification Required: No data recorded Additional Information for Danger to Others Potential: No data recorded Additional Comments for Danger to Others Potential: No data recorded Are There Guns or Other Weapons in Your Home? No data recorded Types of Guns/Weapons: No data recorded Are These Weapons Safely Secured?                            No data recorded Who Could Verify You Are Able To Have These Secured: No data recorded Do You Have any Outstanding Charges, Pending Court Dates, Parole/Probation? No data recorded Contacted To Inform of Risk of Harm To Self or Others: No data recorded  Location of Assessment: [facility] ED   Does Patient Present under Involuntary Commitment? Yes  IVC Papers Initial File Date: 04/19/22   [place] of Residence: [place]   Patient Currently Receiving the Following Services: No data recorded  Determination of Need: Emergent (2 hours)   Options For Referral: No data recorded    CCA Biopsychosocial Intake/Chief Complaint:  No data recorded Current Symptoms/Problems: No data recorded  Patient Reported Schizophrenia/Schizoaffective Diagnosis in Past: -- (Unknown)   Strengths: Patient is able to communicate  Preferences: No data recorded Abilities: No data recorded  Type of Services Patient Feels are Needed: No data recorded  Initial Clinical Notes/Concerns: No data recorded  Mental Health Symptoms Depression:   Sleep (too much or little)   Duration of Depressive symptoms:  Greater than two weeks   Mania:   Change in energy/activity;  Increased Energy; Irritability; Racing thoughts; Recklessness   Anxiety:    Difficulty concentrating; Irritability; Restlessness   Psychosis:   Grossly disorganized speech   Duration of Psychotic symptoms:  Greater than six months   Trauma:   -- (UTA)   Obsessions:   -- (UTA)   Compulsions:   Absent insight/delusional; Poor Insight   Inattention:   None   Hyperactivity/Impulsivity:   None   Oppositional/Defiant Behaviors:   None   Emotional Irregularity:   None   Other Mood/Personality Symptoms:  No data recorded   Mental Status Exam Appearance and self-care  Stature:   Average   Weight:   Overweight   Clothing:   Disheveled; Dirty   Grooming:   Neglected   Cosmetic use:   None   Posture/gait:   Normal   Motor activity:   Restless   Sensorium  Attention:   Confused   Concentration:   Anxiety interferes; Scattered   Orientation:   Person; Place   Recall/memory:   Defective in Short-term   Affect and Mood  Affect:   Anxious; Labile   Mood:   Anxious; Irritable   Relating  Eye  contact:   Avoided   Facial expression:   Responsive   Attitude toward examiner:   Cooperative   Thought and Language  Speech flow:  Flight of Ideas; Garbled; Pressured   Thought content:   Suspicious   Preoccupation:   Ruminations   Hallucinations:   -- (UTA)   Organization:  No data recorded  Company secretary of Knowledge:   Poor   Intelligence:   Average   Abstraction:   Functional   Judgement:   Poor   Reality Testing:   Distorted   Insight:   Lacking   Decision Making:   Impulsive   Social Functioning  Social Maturity:   Irresponsible   Social Judgement:   "Chief of Staff"   Stress  Stressors:   Housing; Office manager Ability:   Contractor Deficits:   None   Supports:   Support needed     Religion: Religion/Spirituality Are You A Religious Person?:  No  Leisure/Recreation: Leisure / Recreation Do You Have Hobbies?: No  Exercise/Diet: Exercise/Diet Do You Exercise?: No Have You Gained or Lost A Significant Amount of Weight in the Past Six Months?: No Do You Follow a Special Diet?: No Do You Have Any Trouble Sleeping?: Yes Explanation of Sleeping Difficulties: It is reported that patient hasn't slept in 4 days   CCA Employment/Education Employment/Work Situation: Employment / Work Situation Employment Situation: On disability Why is Patient on Disability: Mental Health How Long has Patient Been on Disability: Unknown Has Patient ever Been in the [employer]?: No  Education: Education Is Patient Currently Attending School?: No Did You Have An Individualized Education Program (IIEP): No Did You Have Any Difficulty At School?: No Patient's Education Has Been Impacted by Current Illness: No   CCA Family/Childhood History Family and Relationship History: Family history Marital status: Separated Separated, when?: Unknown What types of issues is patient dealing with in the relationship?: Unknown Additional relationship information: Unknown Does patient have children?:  (Unknown)  Childhood History:  Childhood History Did patient suffer any verbal/emotional/physical/sexual abuse as a child?:  (UTA) Did patient suffer from severe childhood neglect?:  (UTA) Has patient ever been sexually abused/assaulted/raped as an adolescent or adult?:  (UTA) Was the patient ever a victim of a crime or a disaster?:  (UTA) Witnessed domestic violence?:  (UA) Has patient been affected by domestic violence as an adult?:  Industrial/product designer)  Child/Adolescent Assessment:     CCA Substance Use Alcohol/Drug Use: Alcohol / Drug Use Pain Medications: See MAR Prescriptions: See MAR Over the Counter: See MAR History of alcohol / drug use?:  (Unknown)                         ASAM's:  Six Dimensions of Multidimensional Assessment  Dimension  1:  Acute Intoxication and/or Withdrawal Potential:      Dimension 2:  Biomedical Conditions and Complications:      Dimension 3:  Emotional, Behavioral, or Cognitive Conditions and Complications:     Dimension 4:  Readiness to Change:     Dimension 5:  Relapse, Continued use, or Continued Problem Potential:     Dimension 6:  Recovery/Living Environment:     ASAM Severity Score:    ASAM Recommended Level of Treatment:     Substance use Disorder (SUD)    Recommendations for Services/Supports/Treatments:    DSM5 Diagnoses: Patient Active Problem List   Diagnosis Date Noted   Cocaine use disorder, severe, dependence (HCC) 04/19/2015  Tobacco use disorder, severe, dependence 04/19/2015   Bipolar I disorder, current episode depressed (HCC) 04/15/2015   Bipolar 1 disorder, depressed (HCC) 04/14/2015   Cocaine abuse (HCC) 04/14/2015    Patient Centered Plan: Patient is on the following Treatment Plan(s):  Impulse Control    Referrals to Alternative Service(s): Referred to Alternative Service(s):   Place:   Date:   Time:    Referred to Alternative Service(s):   Place:   Date:   Time:    Referred to Alternative Service(s):   Place:   Date:   Time:    Referred to Alternative Service(s):   Place:   Date:   Time:      @BHCOLLABOFCARE @  , LCAS-A

## 2022-04-19 NOTE — ED Provider Notes (Signed)
[facility] Provider Note    Event Date/Time   First MD Initiated Contact with Patient 04/19/22 [phone]     (approximate)   History   Psychiatric Evaluation   HPI  Wayne Santiago is a 51 y.o. male who on review of notes including review of [facility] from [facility] from January has a history of bipolar disorder and schizoaffective disorder.  Patient comes in today, reportedly was caught by police stealing from a gas station.  At that time he reported to police that he had been "poisoned", the patient will not elaborate on what that means.  Patient reports right now he does not wish to be evaluated, he wants to leave.  He does not wish to be touched and does not want [place] to do any testing.  He would like to be able to leave and wants to go get some rest.  The patient adamantly denies any desire to harm himself or anyone else.  He does not have any suicidal ideations.  He is homeless.  I offered and discussed obtaining further evaluation, nurse also at the bedside Crystal, and we discussed that we would like to be able to check lab tests and that we want to make sure that there is no evidence of problems such as dehydration, low blood counts, problems with electrolytes, etc.  Patient however declines this.  Reports he just wants to be able to sleep and does not want to be disturbed but refuses medical evaluation.     Physical Exam   Triage Vital Signs: ED Triage Vitals [04/19/22 0851]  Enc Vitals Group     BP 137/74     Pulse Rate 88     Resp 18     Temp 98 F (36.7 C)     Temp Source Axillary     SpO2 98 %     Weight 202 lb 13.2 oz (92 kg)     Height 6' (1.829 m)     Head Circumference      Peak Flow      Pain Score 0     Pain Loc      Pain Edu?      Excl. in GC?   His vital signs are interpreted as normal.  Most recent vital signs: Vitals:   04/19/22 0851  BP: 137/74  Pulse: 88  Resp: 18  Temp: 98 F (36.7 C)  SpO2: 98%      General: Awake, no distress.  He is oriented to place situation and location.  He reports he was at the gas station.  Understands he received a citation from police He does refused to allow me to perform physical examination aside from visual inspection CV:  Good peripheral perfusion.  Resp:  Normal effort.  Abd:  No distention.  Abdominal obesity.  This He does not appear to be in any pain or distress. Other:  He is alert, oriented.  He at times speaks with slightly pressured speech, but denies desire to harm himself or anyone else.  He is nontoxic, and in no acute distress at this time.  ED Results / Procedures / Treatments   Labs (all labs ordered are listed, but only abnormal results are displayed) Labs Reviewed - No data to display   EKG     RADIOLOGY     PROCEDURES:  Critical Care performed: No  Procedures   MEDICATIONS ORDERED IN ED: Medications - No data to display   IMPRESSION / MDM / ASSESSMENT  AND PLAN / ED COURSE  I reviewed the triage vital signs and the nursing notes.                              Differential diagnosis includes, but is not limited to, medical screening evaluation, suspect underlying bipolar disorder.  At this time, the patient does not have evidence that would require involuntary commitment.  Specifically he has no desire to harm himself or anyone else.  He is lucid, he is able to provide history and is oriented.  He was evidently caught stealing from a gas station.  He reports he was "poisoned" several days ago but he cannot describe what that is any further than to tell me that he just wants to be able to rest at the emergency room and does not wish for [place] to touch him or check any test.  I tried to convince patient to allow medical examination and evaluation, based on findings performing at this time I do not see that the patient needs to be sedated or placed under involuntary commitment.  I certainly have offered medical care  and evaluation, and the patient understands this, but does not wish to stay in the emergency department further.  Given this we will discharge him, his condition is stable and I see no evidence of emergent medical condition at this time.  Caveat being patient does refuse labs and further examination, but I believe he has capacity to make this decision at this time and he does not show signs or symptoms that would suggest a need to place him under involuntary commitment.  He maintains his capacity to make medical decisions at this time, and thus will discharge patient as desired.  Recommendations and follow-up including resources including [facility] recommended to the patient.   Return precautions and treatment recommendations and follow-up discussed with the patient who is agreeable with the plan.     FINAL CLINICAL IMPRESSION(S) / ED DIAGNOSES   Final diagnoses:  Homelessness     Rx / DC Orders   ED Discharge Orders     None        Note:  This document was prepared using Dragon voice recognition software and may include unintentional dictation errors.   Sharyn Creamer, MD 04/19/22 [phone]

## 2022-04-19 NOTE — Progress Notes (Addendum)
Patient admitted to unit, refused all care. Admission assessment not completed  in its entirety. Parts completed from information received from provider notes and report from ED nurse. Patient is logical and coherent, no psychosis noted, angry that he has to be here. Irritable, inappropriate to staff. Demanding at staff and is requesting to be discharged. Denies any SI, HI, AVH.

## 2022-04-19 NOTE — ED Notes (Signed)
INVOLUNTARY with all papers on chart/awaiting TTS/PSYCH consult ?

## 2022-04-19 NOTE — ED Notes (Signed)
Pt is being verbally abusive to this RN. Pt states "you are ugly as hell and I need you to get the hell out of my room. Get me a doctor in here. I just need to sleep." Pt is rambling and not making a lot of sense.
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
## 2022-04-19 NOTE — ED Triage Notes (Signed)
Pt is ivc a this time and brought by [place]. Pt has been to the ER once prior today but left without treatment, pt has a hx of bipolar and states that he hasn't been on his medications, it was reported that he had bizarre behavior at the store. Pt is sleepy at this time but denies drinking alcohol or using street drugs, states that he has slept well in the past 4 days and doesn't have a place to stay at this time, pt smells strong of body odor

## 2022-04-19 NOTE — ED Notes (Signed)
Moved to [facility]-3

## 2022-04-19 NOTE — BH Assessment (Signed)
Patient is to be admitted to [facility] by Psychiatric Nurse Practitioner Gillermo Murdoch.  Attending Physician will be Dr.  Toni Amend .   Patient has been assigned to room 304, by [facility] Charge Nurse Marylu Lund.    ER staff is aware of the admission: [facility] ER Secretary   Dr. Sidney Ace, ER MD  Thayer Ohm Patient's Nurse  Rosey Bath Patient Access.

## 2022-04-19 NOTE — ED Notes (Signed)
Pt dressed into wine colored scrubs by this tech and [employer] officer. Pt items placed in belongings bags are the following: Grey shoes Blue jeans Blue underwear  Pt insisted on throwing away his pair of white socks when given hospital socks. Pt cooperative at this time.

## 2022-04-19 NOTE — Tx Team (Signed)
Initial Treatment Plan 04/19/2022 10:57 PM Wayne Santiago JME:[id number]    PATIENT STRESSORS: Financial difficulties   Medication change or noncompliance     PATIENT STRENGTHS: Average or above average intelligence  Capable of independent living  Communication skills    PATIENT IDENTIFIED PROBLEMS: Bipolar Disorder  Medication non compliance                   DISCHARGE CRITERIA:  Improved stabilization in mood, thinking, and/or behavior  PRELIMINARY DISCHARGE PLAN: Outpatient therapy  PATIENT/FAMILY INVOLVEMENT: This treatment plan has been presented to and reviewed with the patient, Wayne Santiago, and/or family member, .  The patient and family have been given the opportunity to ask questions and make suggestions.  Wayne Media, RN 04/19/2022, 10:57 PM
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
## 2022-04-19 NOTE — ED Notes (Signed)
Psych team in to speak with pt at this time

## 2022-04-19 NOTE — ED Notes (Signed)
MD to bedside. Pt is refusing all testing and assessment. Pt states "do not touch me. I just want to sleep."

## 2022-04-20 DIAGNOSIS — F319 Bipolar disorder, unspecified: Secondary | ICD-10-CM | POA: Diagnosis not present

## 2022-04-20 MED ORDER — LORAZEPAM 2 MG PO TABS
2.0000 mg | ORAL_TABLET | ORAL | Status: DC | PRN
  Administered 2022-04-21: 2 mg via ORAL
  Filled 2022-04-20 (×2): qty 1

## 2022-04-20 MED ORDER — HALOPERIDOL 5 MG PO TABS: 5.0000 mg | ORAL_TABLET | Freq: Two times a day (BID) | ORAL | Status: DC

## 2022-04-20 MED ORDER — HALOPERIDOL LACTATE 5 MG/ML IJ SOLN: 5.0000 mg | Freq: Two times a day (BID) | INTRAMUSCULAR | Status: DC

## 2022-04-20 MED ORDER — ARIPIPRAZOLE 5 MG PO TABS
15.0000 mg | ORAL_TABLET | Freq: Every day | ORAL | Status: DC
  Administered 2022-04-21: 15 mg via ORAL
  Filled 2022-04-20 (×2): qty 1

## 2022-04-20 MED ORDER — LORAZEPAM 2 MG/ML IJ SOLN: 2.0000 mg | INTRAMUSCULAR | Status: DC | PRN

## 2022-04-20 NOTE — Group Note (Signed)
[facility] LCSW Group Therapy Note   Group Date: 04/20/2022 Start Time: 1300 End Time: 1400  Type of Therapy/Topic:  Group Therapy:  Feelings about Diagnosis  Participation Level:  None    Description of Group:    This group will allow patients to explore their thoughts and feelings about diagnoses they have received. Patients will be guided to explore their level of understanding and acceptance of these diagnoses. Facilitator will encourage patients to process their thoughts and feelings about the reactions of others to their diagnosis, and will guide patients in identifying ways to discuss their diagnosis with significant others in their lives. This group will be process-oriented, with patients participating in exploration of their own experiences as well as giving and receiving support and challenge from other group members.   Therapeutic Goals: 1. Patient will demonstrate understanding of diagnosis as evidence by identifying two or more symptoms of the disorder:  2. Patient will be able to express two feelings regarding the diagnosis 3. Patient will demonstrate ability to communicate their needs through discussion and/or role plays  Summary of Patient Progress: Patient was present for part of the group but did not participate, instead he giggled inappropriately during his time in the room.   Therapeutic Modalities:   Cognitive Behavioral Therapy Brief Therapy Feelings Identification    Glenis Smoker, LCSW

## 2022-04-20 NOTE — [facility] Suicide Risk Assessment (Signed)
[facility] Admission Suicide Risk Assessment   Nursing information obtained from:  Other (Comment) (chart) Demographic factors:  Male, Caucasian Current Mental Status:  NA Loss Factors:  NA Historical Factors:  NA Risk Reduction Factors:  NA  Total Time spent with patient: 45 minutes Principal Problem: Bipolar 1 disorder (HCC) Diagnosis:  Principal Problem:   Bipolar 1 disorder (HCC)  Subjective Data: Patient seen briefly.  He would not cooperate with any interview or evaluation with me today.  Chart reviewed.  51 year old man who came to the emergency room in the morning yesterday and was discharged only to return under IVC in the afternoon.  Somewhat vague reports about the patient being psychotic and confused and possibly having suicidal ideation.  It looks like he has not been very cooperative with any of the interview so far.  Attempted to speak with him today and he refused to get out of bed.  He cursed at me and made bizarre hostile gestures but did not act as though he were actually going to assault me.  Nothing that he said made any sense.  Drug screen was negative alcohol level negative.  No signs of toxicity.  Labs largely unremarkable.  Nothing really known about his current social situation  Continued Clinical Symptoms:    The "Alcohol Use Disorders Identification Test", Guidelines for Use in Primary Care, Second Edition.  World Science writer [facility]). Score between 0-7:  no or low risk or alcohol related problems. Score between 8-15:  moderate risk of alcohol related problems. Score between 16-19:  high risk of alcohol related problems. Score 20 or above:  warrants further diagnostic evaluation for alcohol dependence and treatment.   CLINICAL FACTORS:   Bipolar Disorder:   Mixed State   Musculoskeletal: Strength & Muscle Tone: within normal limits Gait & Station: normal Patient leans: N/A  Psychiatric Specialty Exam:  Presentation  General Appearance: Bizarre  Eye  Contact:Minimal  Speech:Garbled  Speech Volume:Decreased  Handedness:Right   Mood and Affect  Mood:Depressed  Affect:Blunt; Inappropriate   Thought Process  Thought Processes:Disorganized  Descriptions of Associations:Loose  Orientation:Partial  Thought Content:Illogical  History of Schizophrenia/Schizoaffective disorder:-- (Unknown)  Duration of Psychotic Symptoms:Greater than six months  Hallucinations:Hallucinations: None  Ideas of Reference:None  Suicidal Thoughts:Suicidal Thoughts: No  Homicidal Thoughts:Homicidal Thoughts: No   Sensorium  Memory:Immediate Poor; Recent Poor; Remote Poor  Judgment:Poor  Insight:Poor   Executive Functions  Concentration:Poor  Attention Span:Poor  Recall:Poor  Fund of Knowledge:Poor  Language:Poor   Psychomotor Activity  Psychomotor Activity:Psychomotor Activity: Normal   Assets  Assets:Communication Skills; Desire for Improvement; Resilience; Social Support   Sleep  Sleep:Sleep: Fair    Physical Exam: Physical Exam Vitals and nursing note reviewed.  Constitutional:      Appearance: Normal appearance.  HENT:     Head: Normocephalic and atraumatic.     Mouth/Throat:     Pharynx: Oropharynx is clear.  Eyes:     Pupils: Pupils are equal, round, and reactive to light.  Cardiovascular:     Rate and Rhythm: Normal rate and regular rhythm.  Pulmonary:     Effort: Pulmonary effort is normal.     Breath sounds: Normal breath sounds.  Abdominal:     General: Abdomen is flat.     Palpations: Abdomen is soft.  Musculoskeletal:        General: Normal range of motion.  Skin:    General: Skin is warm and dry.  Neurological:     General: No focal deficit present.  Mental Status: He is alert. Mental status is at baseline.  Psychiatric:        Attention and Perception: He is inattentive.        Mood and Affect: Affect is inappropriate.        Speech: He is noncommunicative.        Behavior:  Behavior is agitated and aggressive.    Review of Systems  Unable to perform ROS: Psychiatric disorder   There were no vitals taken for this visit. There is no height or weight on file to calculate BMI.   COGNITIVE FEATURES THAT CONTRIBUTE TO RISK:  Loss of executive function    SUICIDE RISK:   Mild:  Suicidal ideation of limited frequency, intensity, duration, and specificity.  There are no identifiable plans, no associated intent, mild dysphoria and related symptoms, good self-control (both objective and subjective assessment), few other risk factors, and identifiable protective factors, including available and accessible social support.  PLAN OF CARE: Continue 15-minute checks.  Start medication although unclear whether he is going to be cooperative.  Continue to try to get some kind of conversation and evaluation.  Monitor frequently for safety.  Ongoing evaluation taking into account dangerousness prior to discharge  I certify that inpatient services furnished can reasonably be expected to improve the patient's condition.   Mordecai Rasmussen, MD 04/20/2022, 11:46 AM

## 2022-04-20 NOTE — Progress Notes (Signed)
Patient is asleep, so this writer will administer scheduled Abilify once he wakes up. MD will be made aware.

## 2022-04-20 NOTE — Progress Notes (Signed)
This writer attempted to talk to patient and he stated that he was busy. MD has been made aware.
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
## 2022-04-20 NOTE — Progress Notes (Signed)
This writer attempted to talk to patient, once again, while he was walking back to his room. This Clinical research associate stated to patient that I had a few questions for him, and he stated "go talk to somebody else, I'm busy".

## 2022-04-20 NOTE — Plan of Care (Signed)
D- Patient alert and oriented. Patient presented in an angry/irritable mood on assessment, unwilling to speak to this Clinical research associate. Patient does not appear to be in any pain, nor acute distress at this time. This writer is unable to assess if patient is experiencing any signs/symptoms of depression/anxiety. This writer was also unable to assess if patient has any SI, HI, AVH. Patient had no stated goals for today.  A- Support and encouragement provided. Routine safety checks conducted every 15 minutes. Patient informed to notify staff with problems or concerns.  R- Patient remains safe at this time.  Problem: Education: Goal: Knowledge of General Education information will improve Description: Including pain rating scale, medication(s)/side effects and non-pharmacologic comfort measures Outcome: Not Progressing   Problem: Health Behavior/Discharge Planning: Goal: Ability to manage health-related needs will improve Outcome: Not Progressing   Problem: Clinical Measurements: Goal: Ability to maintain clinical measurements within normal limits will improve Outcome: Not Progressing Goal: Will remain free from infection Outcome: Not Progressing Goal: Diagnostic test results will improve Outcome: Not Progressing Goal: Respiratory complications will improve Outcome: Not Progressing Goal: Cardiovascular complication will be avoided Outcome: Not Progressing   Problem: Activity: Goal: Risk for activity intolerance will decrease Outcome: Not Progressing   Problem: Nutrition: Goal: Adequate nutrition will be maintained Outcome: Not Progressing   Problem: Coping: Goal: Level of anxiety will decrease Outcome: Not Progressing   Problem: Elimination: Goal: Will not experience complications related to bowel motility Outcome: Not Progressing Goal: Will not experience complications related to urinary retention Outcome: Not Progressing   Problem: Pain Managment: Goal: General experience of comfort  will improve Outcome: Not Progressing   Problem: Safety: Goal: Ability to remain free from injury will improve Outcome: Not Progressing   Problem: Skin Integrity: Goal: Risk for impaired skin integrity will decrease Outcome: Not Progressing   Problem: Education: Goal: Knowledge of [place] [facility] Education information/materials will improve Outcome: Not Progressing Goal: Emotional status will improve Outcome: Not Progressing Goal: Mental status will improve Outcome: Not Progressing Goal: Verbalization of understanding the information provided will improve Outcome: Not Progressing   Problem: Activity: Goal: Interest or engagement in activities will improve Outcome: Not Progressing Goal: Sleeping patterns will improve Outcome: Not Progressing   Problem: Coping: Goal: Ability to verbalize frustrations and anger appropriately will improve Outcome: Not Progressing Goal: Ability to demonstrate self-control will improve Outcome: Not Progressing   Problem: Health Behavior/Discharge Planning: Goal: Identification of resources available to assist in meeting health care needs will improve Outcome: Not Progressing Goal: Compliance with treatment plan for underlying cause of condition will improve Outcome: Not Progressing   Problem: Physical Regulation: Goal: Ability to maintain clinical measurements within normal limits will improve Outcome: Not Progressing   Problem: Safety: Goal: Periods of time without injury will increase Outcome: Not Progressing   Problem: Activity: Goal: Will verbalize the importance of balancing activity with adequate rest periods Outcome: Not Progressing   Problem: Education: Goal: Will be free of psychotic symptoms Outcome: Not Progressing Goal: Knowledge of the prescribed therapeutic regimen will improve Outcome: Not Progressing   Problem: Coping: Goal: Coping ability will improve Outcome: Not Progressing Goal: Will verbalize  feelings Outcome: Not Progressing   Problem: Health Behavior/Discharge Planning: Goal: Compliance with prescribed medication regimen will improve Outcome: Not Progressing   Problem: Nutritional: Goal: Ability to achieve adequate nutritional intake will improve Outcome: Not Progressing   Problem: Role Relationship: Goal: Ability to communicate needs accurately will improve Outcome: Not Progressing Goal: Ability to interact with others will improve  Outcome: Not Progressing   Problem: Safety: Goal: Ability to redirect hostility and anger into socially appropriate behaviors will improve Outcome: Not Progressing Goal: Ability to remain free from injury will improve Outcome: Not Progressing   Problem: Self-Care: Goal: Ability to participate in self-care as condition permits will improve Outcome: Not Progressing   Problem: Self-Concept: Goal: Will verbalize positive feelings about self Outcome: Not Progressing

## 2022-04-20 NOTE — Progress Notes (Signed)
Recreation Therapy Notes   Date: 04/20/2022  Time: 10:45 am    Location: Courtyard     Behavioral response: N/A   Intervention Topic: Social Skills   Discussion/Intervention: Patient refused to attend group.   Clinical Observations/Feedback:  Patient refused to attend group.    Margreat Widener LRT/CTRS        Brandie Lopes 04/20/2022 12:57 PM
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
## 2022-04-20 NOTE — Progress Notes (Signed)
This Clinical research associate reached out to MD regarding patient and his verbal aggression, refusing Abilify, and new PRN orders were placed.

## 2022-04-21 DIAGNOSIS — F319 Bipolar disorder, unspecified: Secondary | ICD-10-CM | POA: Diagnosis not present

## 2022-04-21 MED ORDER — ARIPIPRAZOLE 5 MG PO TABS
15.0000 mg | ORAL_TABLET | Freq: Two times a day (BID) | ORAL | Status: DC
  Administered 2022-04-21 – 2022-04-26 (×10): 15 mg via ORAL
  Filled 2022-04-21 (×10): qty 1

## 2022-04-21 MED ORDER — HALOPERIDOL 5 MG PO TABS
5.0000 mg | ORAL_TABLET | Freq: Four times a day (QID) | ORAL | Status: DC | PRN
  Administered 2022-04-21 – 2022-04-26 (×2): 5 mg via ORAL
  Filled 2022-04-21 (×2): qty 1

## 2022-04-21 MED ORDER — HALOPERIDOL LACTATE 5 MG/ML IJ SOLN: 5.0000 mg | Freq: Four times a day (QID) | INTRAMUSCULAR | Status: DC | PRN

## 2022-04-21 MED ORDER — NICOTINE POLACRILEX 2 MG MT GUM
2.0000 mg | CHEWING_GUM | OROMUCOSAL | Status: DC | PRN
  Administered 2022-04-21 – 2022-04-26 (×21): 2 mg via ORAL
  Filled 2022-04-21 (×29): qty 1
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
## 2022-04-21 NOTE — BHH Counselor (Signed)
CSW received a call from Norma Fredrickson, Adult [place], [facility] [phone]). Hewitt Blade informed SW that he is pt's guardian. CSW asked that paperwork be sent to email. Hewitt Blade agreed and gave CSW cell number for further contact [phone]). CSW informed him that call would be return once progression meeting was completed. No other concerns expressed. Contact ended without incident.   CSW received copy of guardianship paperwork.   CSW attempted to contact guardian, Norma Fredrickson [phone]) to follow up regarding completion of assessment and discussion for continuity of care. Unable to make contact but able to leave HIPPA compliant voicemail with contact information for follow through.   CSW will continue to follow.   Vilma Meckel. Algis Greenhouse, MSW, LCSW, LCAS 04/21/2022 1:20 PM

## 2022-04-21 NOTE — BH IP Treatment Plan (Addendum)
Interdisciplinary Treatment and Diagnostic Plan Update  04/21/2022 Time of Session: 11:10 AM Wayne Santiago MRN: [id number]  Principal Diagnosis: Bipolar 1 disorder (HCC)  Secondary Diagnoses: Principal Problem:   Bipolar 1 disorder (HCC)   Current Medications:  Current Facility-Administered Medications  Medication Dose Route Frequency Provider Last Rate Last Admin   acetaminophen (TYLENOL) tablet 650 mg  650 mg Oral Q6H PRN Gillermo Murdoch, NP       alum & mag hydroxide-simeth (MAALOX/MYLANTA) 200-200-20 MG/5ML suspension 30 mL  30 mL Oral Q4H PRN Gillermo Murdoch, NP       ARIPiprazole (ABILIFY) tablet 15 mg  15 mg Oral Daily Clapacs, Jackquline Denmark, MD   15 mg at 04/21/22 1009   LORazepam (ATIVAN) tablet 2 mg  2 mg Oral Q4H PRN Clapacs, Jackquline Denmark, MD       Or   LORazepam (ATIVAN) injection 2 mg  2 mg Intramuscular Q4H PRN Clapacs, Jackquline Denmark, MD       magnesium hydroxide (MILK OF MAGNESIA) suspension 30 mL  30 mL Oral Daily PRN Gillermo Murdoch, NP       nicotine polacrilex (NICORETTE) gum 2 mg  2 mg Oral PRN Clapacs, Jackquline Denmark, MD       traZODone (DESYREL) tablet 100 mg  100 mg Oral QHS Gillermo Murdoch, NP       PTA Medications: Medications Prior to Admission  Medication Sig Dispense Refill Last Dose   benztropine (COGENTIN) 1 MG tablet Take 1 tablet (1 mg total) by mouth 2 (two) times daily. (Patient not taking: Reported on 04/19/2022) 60 tablet 0    PARoxetine (PAXIL) 10 MG tablet Take 10 mg by mouth daily. (Patient not taking: Reported on 04/19/2022)      traZODone (DESYREL) 100 MG tablet Take 1 tablet (100 mg total) by mouth at bedtime. (Patient not taking: Reported on 04/19/2022) 30 tablet 0    ziprasidone (GEODON) 40 MG capsule Take 1 capsule (40 mg total) by mouth at bedtime. (Patient not taking: Reported on 04/19/2022) 30 capsule 0     Patient Stressors: Financial difficulties   Medication change or noncompliance    Patient Strengths: Average or above average  intelligence  Capable of independent living  Communication skills   Treatment Modalities: Medication Management, Group therapy, Case management,  1 to 1 session with clinician, Psychoeducation, Recreational therapy.   Physician Treatment Plan for Primary Diagnosis: Bipolar 1 disorder (HCC) Long Term Goal(s): Improvement in symptoms so as ready for discharge   Short Term Goals: Compliance with prescribed medications will improve Ability to verbalize feelings will improve Ability to disclose and discuss suicidal ideas Ability to demonstrate self-control will improve  Medication Management: Evaluate patient's response, side effects, and tolerance of medication regimen.  Therapeutic Interventions: 1 to 1 sessions, Unit Group sessions and Medication administration.  Evaluation of Outcomes: Not Progressing  Physician Treatment Plan for Secondary Diagnosis: Principal Problem:   Bipolar 1 disorder (HCC)  Long Term Goal(s): Improvement in symptoms so as ready for discharge   Short Term Goals: Compliance with prescribed medications will improve Ability to verbalize feelings will improve Ability to disclose and discuss suicidal ideas Ability to demonstrate self-control will improve     Medication Management: Evaluate patient's response, side effects, and tolerance of medication regimen.  Therapeutic Interventions: 1 to 1 sessions, Unit Group sessions and Medication administration.  Evaluation of Outcomes: Not Progressing   RN Treatment Plan for Primary Diagnosis: Bipolar 1 disorder (HCC) Long Term Goal(s): Knowledge of disease and therapeutic regimen to  maintain health will improve  Short Term Goals: Ability to remain free from injury will improve, Ability to verbalize frustration and anger appropriately will improve, Ability to demonstrate self-control, Ability to participate in decision making will improve, Ability to verbalize feelings will improve, Ability to disclose and discuss  suicidal ideas, Ability to identify and develop effective coping behaviors will improve, and Compliance with prescribed medications will improve  Medication Management: RN will administer medications as ordered by provider, will assess and evaluate patient's response and provide education to patient for prescribed medication. RN will report any adverse and/or side effects to prescribing provider.  Therapeutic Interventions: 1 on 1 counseling sessions, Psychoeducation, Medication administration, Evaluate responses to treatment, Monitor vital signs and CBGs as ordered, Perform/monitor CIWA, COWS, AIMS and Fall Risk screenings as ordered, Perform wound care treatments as ordered.  Evaluation of Outcomes: Not Progressing   LCSW Treatment Plan for Primary Diagnosis: Bipolar 1 disorder (HCC) Long Term Goal(s): Safe transition to appropriate next level of care at discharge, Engage patient in therapeutic group addressing interpersonal concerns.  Short Term Goals: Engage patient in aftercare planning with referrals and resources, Increase social support, Increase ability to appropriately verbalize feelings, Increase emotional regulation, Facilitate acceptance of mental health diagnosis and concerns, and Increase skills for wellness and recovery  Therapeutic Interventions: Assess for all discharge needs, 1 to 1 time with Social worker, Explore available resources and support systems, Assess for adequacy in community support network, Educate family and significant other(s) on suicide prevention, Complete Psychosocial Assessment, Interpersonal group therapy.  Evaluation of Outcomes: Not Progressing   Progress in Treatment: Attending groups: Yes. Participating in groups: No. Taking medication as prescribed: Yes. Toleration medication: Yes. Family/Significant other contact made: No, will contact:  guardian, Norma Fredrickson. Patient understands diagnosis: No. Discussing patient identified problems/goals  with staff: No. Medical problems stabilized or resolved: Yes. Denies suicidal/homicidal ideation: Yes. Issues/concerns per patient self-inventory: No. Other: none.  New problem(s) identified: No, Describe:  none identified.   New Short Term/Long Term Goal(s): elimination of symptoms of psychosis, medication management for mood stabilization; elimination of SI thoughts; development of comprehensive mental wellness plan.  Patient Goals: Pt was disorganized and spoke with raised tones in an aggressive manner. "I don't know. I'll work on myself if you give me some ideas." When leaving pt began calling staff obscenities stating that they would not get him.    Discharge Plan or Barriers: CSW will work with guardian to develop an appropriate aftercare/discharge plan.   Reason for Continuation of Hospitalization: Aggression Hallucinations Medication stabilization  Estimated Length of Stay: 1-7 days  Last 3 [place] Suicide Severity Risk Score: Flowsheet Row Admission (Current) from 04/19/2022 in [facility] INPATIENT BEHAVIORAL MEDICINE Most recent reading at 04/19/2022 11:00 PM ED from 04/19/2022 in [facility] EMERGENCY DEPARTMENT Most recent reading at 04/19/2022  4:28 PM ED from 04/19/2022 in [facility] EMERGENCY DEPARTMENT Most recent reading at 04/19/2022  8:52 AM  C-SSRS RISK CATEGORY No Risk No Risk No Risk       Last PHQ 2/9 Scores:     No data to display          Scribe for Treatment Team: Glenis Smoker, LCSW 04/21/2022 11:32 AM

## 2022-04-21 NOTE — Group Note (Signed)
[facility] LCSW Group Therapy Note   Group Date: 04/21/2022 Start Time: 1300 End Time: 1400  Type of Therapy and Topic:  Group Therapy:  Feelings around Relapse and Recovery  Participation Level:  Did Not Attend    Description of Group:    Patients in this group will discuss emotions they experience before and after a relapse. They will process how experiencing these feelings, or avoidance of experiencing them, relates to having a relapse. Facilitator will guide patients to explore emotions they have related to recovery. Patients will be encouraged to process which emotions are more powerful. They will be guided to discuss the emotional reaction significant others in their lives may have to patients' relapse or recovery. Patients will be assisted in exploring ways to respond to the emotions of others without this contributing to a relapse.  Therapeutic Goals: Patient will identify two or more emotions that lead to relapse for them:  Patient will identify two emotions that result when they relapse:  Patient will identify two emotions related to recovery:  Patient will demonstrate ability to communicate their needs through discussion and/or role plays.   Summary of Patient Progress:  Group not held due to acuity on the unit.   Therapeutic Modalities:   Cognitive Behavioral Therapy Solution-Focused Therapy Assertiveness Training Relapse Prevention Therapy   Rj Pedrosa R Lucia Mccreadie, LCSW 

## 2022-04-21 NOTE — Progress Notes (Signed)
Attempted to give medications twice. Patient refused with a hostile affect and verbally aggressive towards staff.

## 2022-04-21 NOTE — Plan of Care (Signed)
Patient did not answer any questions directly instead cursing and yelling at staff. When staff encouraged for shower patient states " you stinky go and take a shower." Patient loud and verbally aggressive in the hallway. Patient compliant with medications after multiple attempts. Appetite and energy level good. Support and encouragement given.

## 2022-04-21 NOTE — Progress Notes (Signed)
Recreation Therapy Notes  INPATIENT RECREATION TR PLAN  Patient Details Name: Wayne Santiago MRN: [id number] DOB: 1971-08-29 Today's Date: 04/21/2022  Rec Therapy Plan Is patient appropriate for Therapeutic Recreation?: Yes Treatment times per week: at least 3 Estimated Length of Stay: 5-7 days TR Treatment/Interventions: Group participation (Comment)  Discharge Criteria Pt will be discharged from therapy if:: Discharged Treatment plan/goals/alternatives discussed and agreed upon by:: Patient/family  Discharge Summary     Keyanni Whittinghill 04/21/2022, 12:19 PM

## 2022-04-21 NOTE — Progress Notes (Signed)
Patient refused vital signs this morning and sleeping at this time.

## 2022-04-21 NOTE — Progress Notes (Signed)
Recreation Therapy Notes  INPATIENT RECREATION THERAPY ASSESSMENT  Patient Details Name: Jatorian Renault MRN: [id number] DOB: 08/22/1971 Today's Date: 04/21/2022       Information Obtained From:  (Patient unable)  Able to Participate in Assessment/Interview: No  Patient Presentation:    Reason for Admission (Per Patient):    Patient Stressors:    Coping Skills:      Leisure Interests (2+):     Frequency of Recreation/Participation:    Awareness of Community Resources:     [employer]:     Current Use:    If no, Barriers?:    Expressed Interest in [employer] Information:    [place] of Residence:     Patient Main Form of Transportation:    Patient Strengths:     Patient Identified Areas of Improvement:     Patient Goal for Hospitalization:     Current SI (including self-harm):     Current HI:     Current AVH:    Staff Intervention Plan:    Consent to Intern Participation:    Jaydian Santana 04/21/2022, 12:19 PM

## 2022-04-21 NOTE — Progress Notes (Signed)
Recreation Therapy Notes  Date: 04/21/2022  Time: 10:40 am     Location: Courtyard   Behavioral response: Disorganized  Intervention Topic:  Wellness    Discussion/Intervention:  Group content today was focused on Wellness. The group defined wellness and some positive ways they make decisions for themselves. Individuals expressed reasons why they neglected any wellness in the past. Patients described ways to improve wellness skills in the future. The group explained what could happen if they did not do any wellness at all. Participants express how bad choices has affected them and others around them. Individual explained the importance of wellness. The group participated in the intervention "Testing my Wellness" where they had a chance to identify some of their weaknesses and strengths in wellness.  Clinical Observations/Feedback: Patient came to group late and was loud. Patient stood by the door laughing to himself, then eventually returned to his room.  Wayne Santiago LRT/CTRS         Ahtziri Jeffries 04/21/2022 12:08 PM

## 2022-04-21 NOTE — [facility] Counselor (Signed)
CSW spoke with guardian, Norma Fredrickson [phone]) briefly. CSW inquired about completing assessment and guardian encouraged CSW to attempt with pt. However, guardian did share some information from their six years working together. Pt has a history of paranoid schizophrenia, mood disorder, and cocaine use. Hewitt Blade went on to share that pt has not been participating in structured treatment in at least a year/no current mental health provider. Guardian expressed desire to attempt to connect pt with ACTT services while in the hospital. He shared that pt has exhausted his resources in [facility], explaining that pt always ends up evicted from group homes and hotels due to noncompliance with rules, his behavior/aggressive manners, and at times breaking property. Hewitt Blade stated that pt migrates towards [facility] because his family his here, however, both mother and sister have restraining orders against pt. No other concerns expressed. Contact ended without incident.   CSW went to unit to attempt completion of assessment with pt. However, upon entering unit CSW heard pt engaged in problematic behaviors/aggression such as physical posturing, verbal intimidation, and derogatory slurs. CSW unable to meet with pt as he was to be given medication to help with agitation.   CSW attempted to contact guardian, Norma Fredrickson regarding completion of the assessment. Contact unable to be established and HIPPA compliant voicemail left with contact information for follow up.  Vilma Meckel. Algis Greenhouse, MSW, LCSW, LCAS 04/21/2022 4:17 PM

## 2022-04-21 NOTE — Plan of Care (Signed)
  Problem: Safety: Goal: Ability to remain free from injury will improve Outcome: Progressing   

## 2022-04-21 NOTE — Progress Notes (Signed)
Patient continues to be verbally aggressive. Refuses care and medication

## 2022-04-22 DIAGNOSIS — F319 Bipolar disorder, unspecified: Secondary | ICD-10-CM | POA: Diagnosis not present

## 2022-04-22 NOTE — [facility] Counselor (Signed)
CSW attempted to complete assessment with this pt. Pt was intrusive asking CSW personal questions. When CSW declined to answer pt's personal questions pt shut down and declined to speak to CSW further.     Ruthann Cancer MSW, LCSW Clincal Social Worker  [facility]

## 2022-04-22 NOTE — Group Note (Signed)
LCSW Group Therapy Note   Group Date: 04/22/2022 Start Time: 1230 End Time: 1330   Type of Therapy and Topic:  Group Therapy: Challenging Core Beliefs  Participation Level:  Did Not Attend  Description of Group:  Patients were educated about core beliefs and asked to identify one harmful core belief that they have. Patients were asked to explore from where those beliefs originate. Patients were asked to discuss how those beliefs make them feel and the resulting behaviors of those beliefs. They were then be asked if those beliefs are true and, if so, what evidence they have to support them. Lastly, group members were challenged to replace those negative core beliefs with helpful beliefs.   Therapeutic Goals:   1. Patient will identify harmful core beliefs and explore the origins of such beliefs. 2. Patient will identify feelings and behaviors that result from those core beliefs. 3. Patient will discuss whether such beliefs are true. 4.  Patient will replace harmful core beliefs with helpful ones.  Summary of Patient Progress:  Did not attend  Therapeutic Modalities: Cognitive Behavioral Therapy; Solution-Focused Therapy   Otelia Santee, LCSWA 04/22/2022  1:20 PM

## 2022-04-22 NOTE — Progress Notes (Signed)
Pt was asleep when this writer got on the unit. Pt did wake up and want a snack then went back to sleep. Pt was asleep when it was time for scheduled night time medication, Pt scheduled medication was held because he was asleep, check MAR. Pt being monitored Q 15 minutes for safety per unit protocol. Pt remains safe on the unit.
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
## 2022-04-22 NOTE — Plan of Care (Addendum)
Patient less aggressive and more cooperative today.Patient visible in the milieu. No loud or verbally aggressive behaviors noted even when his request got denied. Patient still talks disorganized and does not make much sense.Patient refused to vital signs states " my blood pressure good." Denies SI,HI and AVH. Compliant with medications. Appetite and energy level good. ADLs maintained. Support and encouragement given.

## 2022-04-22 NOTE — Progress Notes (Signed)
Patient observed interacting appropriately with staff and peers on the unit. Pt refused his night medication, check MAR. Pt less irritable tonight. Pt denies SI/HI/AVH. Pt given education, support, and encouragement to be active in his treatment plan. Pt being monitored Q 15 minutes for safety per unit protocol. Pt remains safe on the unit.
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
## 2022-04-23 DIAGNOSIS — F319 Bipolar disorder, unspecified: Secondary | ICD-10-CM | POA: Diagnosis not present

## 2022-04-23 NOTE — Plan of Care (Signed)
D- Patient alert and oriented. Patient presented in a preoccupied, but pleasant mood on assessment stating that he slept "good" last night, "I had all kinds of dreams. I was way [place] in the [place]". Patient had no complaints to voice to this writer, however, he was fixated on telling this Clinical research associate about a "snake bite from ten years ago in [place]".  Patient also stated that the spot on his arm, from the said snake bite, "makes me itch", however, patient stated that the reason he likes to take Abilify is because "it keeps me from itching". Patient denied SI, HI, AVH, and pain at this time. Patient also denies any sign/symptoms of depression and anxiety, stating "if it's not any air in that basketball, my mood's going to be bad". Patient's goal for today is "if I can go play basketball, I'll be good".  A- Scheduled medications administered to patient, per MD orders. Support and encouragement provided.  Routine safety checks conducted every 15 minutes.  Patient informed to notify staff with problems or concerns.  R- No adverse drug reactions noted. Patient contracts for safety at this time. Patient compliant with medications. Patient receptive, calm, and cooperative. Patient remains safe at this time.  Problem: Education: Goal: Knowledge of General Education information will improve Description: Including pain rating scale, medication(s)/side effects and non-pharmacologic comfort measures Outcome: Progressing   Problem: Health Behavior/Discharge Planning: Goal: Ability to manage health-related needs will improve Outcome: Progressing   Problem: Clinical Measurements: Goal: Ability to maintain clinical measurements within normal limits will improve Outcome: Progressing Goal: Will remain free from infection Outcome: Progressing Goal: Diagnostic test results will improve Outcome: Progressing Goal: Respiratory complications will improve Outcome: Progressing Goal: Cardiovascular complication will be  avoided Outcome: Progressing   Problem: Activity: Goal: Risk for activity intolerance will decrease Outcome: Progressing   Problem: Nutrition: Goal: Adequate nutrition will be maintained Outcome: Progressing   Problem: Coping: Goal: Level of anxiety will decrease Outcome: Progressing   Problem: Elimination: Goal: Will not experience complications related to bowel motility Outcome: Progressing Goal: Will not experience complications related to urinary retention Outcome: Progressing   Problem: Pain Managment: Goal: General experience of comfort will improve Outcome: Progressing   Problem: Safety: Goal: Ability to remain free from injury will improve Outcome: Progressing   Problem: Skin Integrity: Goal: Risk for impaired skin integrity will decrease Outcome: Progressing   Problem: Education: Goal: Knowledge of [facility] Education information/materials will improve Outcome: Progressing Goal: Emotional status will improve Outcome: Progressing Goal: Mental status will improve Outcome: Progressing Goal: Verbalization of understanding the information provided will improve Outcome: Progressing   Problem: Activity: Goal: Interest or engagement in activities will improve Outcome: Progressing Goal: Sleeping patterns will improve Outcome: Progressing   Problem: Coping: Goal: Ability to verbalize frustrations and anger appropriately will improve Outcome: Progressing Goal: Ability to demonstrate self-control will improve Outcome: Progressing   Problem: Health Behavior/Discharge Planning: Goal: Identification of resources available to assist in meeting health care needs will improve Outcome: Progressing Goal: Compliance with treatment plan for underlying cause of condition will improve Outcome: Progressing   Problem: Physical Regulation: Goal: Ability to maintain clinical measurements within normal limits will improve Outcome: Progressing   Problem:  Safety: Goal: Periods of time without injury will increase Outcome: Progressing   Problem: Activity: Goal: Will verbalize the importance of balancing activity with adequate rest periods Outcome: Progressing   Problem: Education: Goal: Will be free of psychotic symptoms Outcome: Progressing Goal: Knowledge of the prescribed therapeutic regimen will improve  Outcome: Progressing   Problem: Coping: Goal: Coping ability will improve Outcome: Progressing Goal: Will verbalize feelings Outcome: Progressing   Problem: Health Behavior/Discharge Planning: Goal: Compliance with prescribed medication regimen will improve Outcome: Progressing   Problem: Nutritional: Goal: Ability to achieve adequate nutritional intake will improve Outcome: Progressing   Problem: Role Relationship: Goal: Ability to communicate needs accurately will improve Outcome: Progressing Goal: Ability to interact with others will improve Outcome: Progressing   Problem: Safety: Goal: Ability to redirect hostility and anger into socially appropriate behaviors will improve Outcome: Progressing Goal: Ability to remain free from injury will improve Outcome: Progressing   Problem: Self-Care: Goal: Ability to participate in self-care as condition permits will improve Outcome: Progressing   Problem: Self-Concept: Goal: Will verbalize positive feelings about self Outcome: Progressing

## 2022-04-23 NOTE — Progress Notes (Signed)
Patient asked this writer if he could have the Nicotine lozenges. This writer explained that we have not used the lozenges, only the patch and gum. Patient asked if he could have both and it was explained to him that he could have either or, in which he verbalized understanding. However, he is asking for the gum every hour, as needed.

## 2022-04-23 NOTE — Progress Notes (Addendum)
Patient just came to the medication room asking for yet another piece of Nicorette gum. This Clinical research associate explained to patient that he had to space them out by two hours. Patient verbalized understanding and went back to the dayroom to watch television.

## 2022-04-23 NOTE — Progress Notes (Signed)
Pt lying in bed with eyes closed; easily aroused when name called. Pt states that he feels "cold"; however, he declined offer for additional blanket for warmth. He currently denies pain and states "I'm going back to sleep" and turned his back towards this nurse. He denies SI/HI but would not answer assessment questions regarding AVH, sleep, appetite, anxiety and depression. No acute distress noted.

## 2022-04-23 NOTE — Group Note (Signed)
LCSW Group Therapy Note  Group Date: 04/23/2022 Start Time: 1300 End Time: 1400   Type of Therapy and Topic:  Group Therapy - Healthy vs Unhealthy Coping Skills  Participation Level:  Did Not Attend   Description of Group The focus of this group was to determine what unhealthy coping techniques typically are used by group members and what healthy coping techniques would be helpful in coping with various problems. Patients were guided in becoming aware of the differences between healthy and unhealthy coping techniques. Patients were asked to identify 2-3 healthy coping skills they would like to learn to use more effectively.  Therapeutic Goals Patients learned that coping is what human beings do all day long to deal with various situations in their lives Patients defined and discussed healthy vs unhealthy coping techniques Patients identified their preferred coping techniques and identified whether these were healthy or unhealthy Patients determined 2-3 healthy coping skills they would like to become more familiar with and use more often. Patients provided support and ideas to each other   Summary of Patient Progress:   Patient did not attend group despite encouraged participation.    Therapeutic Modalities Cognitive Behavioral Therapy Motivational Interviewing  Halsey Persaud W Kortny Lirette, LCSWA 04/23/2022  2:22 PM   
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
## 2022-04-23 NOTE — Progress Notes (Signed)
Although patient's conversation has been tangential and loosely associated, he has been more talkative with this Clinical research associate today than he was last week.

## 2022-04-24 DIAGNOSIS — F319 Bipolar disorder, unspecified: Secondary | ICD-10-CM | POA: Diagnosis not present

## 2022-04-24 NOTE — Group Note (Signed)
[facility] LCSW Group Therapy Note    Group Date: 04/24/2022 Start Time: 1300 End Time: 1400  Type of Therapy and Topic:  Group Therapy:  Overcoming Obstacles  Participation Level:  [facility] PARTICIPATION LEVEL: Active   Description of Group:   In this group patients will be encouraged to explore what they see as obstacles to their own wellness and recovery. They will be guided to discuss their thoughts, feelings, and behaviors related to these obstacles. The group will process together ways to cope with barriers, with attention given to specific choices patients can make. Each patient will be challenged to identify changes they are motivated to make in order to overcome their obstacles. This group will be process-oriented, with patients participating in exploration of their own experiences as well as giving and receiving support and challenge from other group members.  Therapeutic Goals: 1. Patient will identify personal and current obstacles as they relate to admission. 2. Patient will identify barriers that currently interfere with their wellness or overcoming obstacles.  3. Patient will identify feelings, thought process and behaviors related to these barriers. 4. Patient will identify two changes they are willing to make to overcome these obstacles:    Summary of Patient Progress Patient was present for the entirety of group. He was actively involved in the discussion and willing shared his own viewpoints. He identified temptation/sin as an obstacle that he has to overcome. Pt stated that he had to get away from old friends who smoke and drink to help address this obstacle. He stated that he needs to add structure into his life to help with his recovery. Pt did go on a tangent to speak about how he prayed to God to be a god and stated that this is how he lives his life now. Although pt was off task at times, he was appropriate and did not become  agitated/hostile or giggle the entire time during the discussion. After group, pt stayed for a bit to speak with the facilitator. Patient appeared more open/receptive to feedback and comments from facilitator than he has during the entirety of his stay.    Therapeutic Modalities:   Cognitive Behavioral Therapy Solution Focused Therapy Motivational Interviewing Relapse Prevention Therapy   Glenis Smoker, LCSW

## 2022-04-24 NOTE — Progress Notes (Signed)
Patient observed interacting appropriately with staff and peers on the unit. Pt refused his night medication, check MAR. Pt denies SI/HI/AVH. Pt given education, support, and encouragement to be active in his treatment plan. Pt being monitored Q 15 minutes for safety per unit protocol. Pt remains safe on the unit.  

## 2022-04-24 NOTE — Progress Notes (Signed)
Patient has been up to the nurses station all day, every two hours on the dot, for his PRN Nicorette gum.
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
## 2022-04-24 NOTE — Progress Notes (Signed)
Recreation Therapy Notes  Date: 04/24/2022  Time: 9:50 am    Location: Courtyard     Behavioral response: N/A   Intervention Topic: Strengths   Discussion/Intervention: Patient refused to attend group.   Clinical Observations/Feedback:  Patient refused to attend group.    Allyiah Gartner LRT/CTRS         Aalaysia Liggins 04/24/2022 11:43 AM

## 2022-04-24 NOTE — Plan of Care (Signed)
D- Patient alert and oriented. Patient presented in a more pleasant mood on assessment with this writer than previous days when I've cared for him, reporting that he slept "poor" last night. Patient did not go into detail as to why he reported this, but he had no complaints to voice to this Clinical research associate. Patient denied any signs/symptoms of depression and anxiety. Patient also denied SI, HI, AVH, and pain at this time stating "no, don't get that started". Patient's goal for today is to "get some aspirin for my corn". When this writer asked if he was experiencing any toe pain, he stated "no, it's alright now".  A- Scheduled medications administered to patient, per MD orders. Support and encouragement provided.  Routine safety checks conducted every 15 minutes.  Patient informed to notify staff with problems or concerns.  R- No adverse drug reactions noted. Patient contracts for safety at this time. Patient compliant with medications. Patient receptive, calm, and cooperative. Patient remains safe at this time.  Problem: Education: Goal: Knowledge of General Education information will improve Description: Including pain rating scale, medication(s)/side effects and non-pharmacologic comfort measures Outcome: Progressing   Problem: Health Behavior/Discharge Planning: Goal: Ability to manage health-related needs will improve Outcome: Progressing   Problem: Clinical Measurements: Goal: Ability to maintain clinical measurements within normal limits will improve Outcome: Progressing Goal: Will remain free from infection Outcome: Progressing Goal: Diagnostic test results will improve Outcome: Progressing Goal: Respiratory complications will improve Outcome: Progressing Goal: Cardiovascular complication will be avoided Outcome: Progressing   Problem: Activity: Goal: Risk for activity intolerance will decrease Outcome: Progressing   Problem: Nutrition: Goal: Adequate nutrition will be  maintained Outcome: Progressing   Problem: Coping: Goal: Level of anxiety will decrease Outcome: Progressing   Problem: Elimination: Goal: Will not experience complications related to bowel motility Outcome: Progressing Goal: Will not experience complications related to urinary retention Outcome: Progressing   Problem: Pain Managment: Goal: General experience of comfort will improve Outcome: Progressing   Problem: Safety: Goal: Ability to remain free from injury will improve Outcome: Progressing   Problem: Skin Integrity: Goal: Risk for impaired skin integrity will decrease Outcome: Progressing   Problem: Education: Goal: Knowledge of [place] [facility] Education information/materials will improve Outcome: Progressing Goal: Emotional status will improve Outcome: Progressing Goal: Mental status will improve Outcome: Progressing Goal: Verbalization of understanding the information provided will improve Outcome: Progressing   Problem: Activity: Goal: Interest or engagement in activities will improve Outcome: Progressing Goal: Sleeping patterns will improve Outcome: Progressing   Problem: Coping: Goal: Ability to verbalize frustrations and anger appropriately will improve Outcome: Progressing Goal: Ability to demonstrate self-control will improve Outcome: Progressing   Problem: Health Behavior/Discharge Planning: Goal: Identification of resources available to assist in meeting health care needs will improve Outcome: Progressing Goal: Compliance with treatment plan for underlying cause of condition will improve Outcome: Progressing   Problem: Physical Regulation: Goal: Ability to maintain clinical measurements within normal limits will improve Outcome: Progressing   Problem: Safety: Goal: Periods of time without injury will increase Outcome: Progressing   Problem: Activity: Goal: Will verbalize the importance of balancing activity with adequate rest  periods Outcome: Progressing   Problem: Education: Goal: Will be free of psychotic symptoms Outcome: Progressing Goal: Knowledge of the prescribed therapeutic regimen will improve Outcome: Progressing   Problem: Coping: Goal: Coping ability will improve Outcome: Progressing Goal: Will verbalize feelings Outcome: Progressing   Problem: Health Behavior/Discharge Planning: Goal: Compliance with prescribed medication regimen will improve Outcome: Progressing   Problem:  Nutritional: Goal: Ability to achieve adequate nutritional intake will improve Outcome: Progressing   Problem: Role Relationship: Goal: Ability to communicate needs accurately will improve Outcome: Progressing Goal: Ability to interact with others will improve Outcome: Progressing   Problem: Safety: Goal: Ability to redirect hostility and anger into socially appropriate behaviors will improve Outcome: Progressing Goal: Ability to remain free from injury will improve Outcome: Progressing   Problem: Self-Care: Goal: Ability to participate in self-care as condition permits will improve Outcome: Progressing   Problem: Self-Concept: Goal: Will verbalize positive feelings about self Outcome: Progressing

## 2022-04-24 NOTE — Progress Notes (Signed)
Patient was given supplies to take a shower.

## 2022-04-24 NOTE — BHH Counselor (Addendum)
CSW attempted to contact guardian, Wayne Santiago [phone]) to follow up regarding discharge/aftercare planning. Unable to establish contact but HIPPA compliant voicemail left with contact information for follow through.   Wayne Santiago. Wayne Santiago, MSW, LCSW, LCAS 04/24/2022 3:12 PM  CSW returned call to guardian, Wayne Santiago [phone]). Wayne Santiago voiced concerns around pt discharge to shelter. He stated that he would like a more stable disposition plan prior to discharge. Wayne Santiago stated that if pt discharges to the shelter then it will not be long before he's back in the hospital again. Guardian states that he feels pt should be in a group home in this area. Wayne Santiago shared that pt had left him voicemails over the weekend, which he described as disturbing, full of profanity and he was saying "just some weird stuff." CSW validated those concerns but informed guardian that further discussion regarding this would have to take place with the provider. He agreed. CSW informed him that another CSW would be working tomorrow and gave Wayne Santiago the contact information for the unit to reach out if he would like to speak further regarding the discharge on tomorrow. He agreed. CSW inquired regarding referrals for ACTT services. Wayne Santiago stated that referrals had been made but that he was still waiting to hear back from providers. No other concerns expressed. Contact ended without incident.   Wayne Santiago. Wayne Santiago, MSW, LCSW, LCAS 04/24/2022 3:59 PM

## 2022-04-25 DIAGNOSIS — F319 Bipolar disorder, unspecified: Secondary | ICD-10-CM | POA: Diagnosis not present

## 2022-04-25 LAB — GLUCOSE, CAPILLARY
Glucose-Capillary: 140 mg/dL — ABNORMAL HIGH (ref 70–99)
Glucose-Capillary: 139 mg/dL — ABNORMAL HIGH (ref 70–99)

## 2022-04-25 MED ORDER — TRAZODONE HCL 100 MG PO TABS
100.0000 mg | ORAL_TABLET | Freq: Every day | ORAL | 0 refills | Status: AC
Start: 2022-04-25 — End: 2022-05-25

## 2022-04-25 MED ORDER — ARIPIPRAZOLE 15 MG PO TABS: 15.0000 mg | ORAL_TABLET | Freq: Two times a day (BID) | ORAL | 0 refills | Status: AC

## 2022-04-25 NOTE — Group Note (Signed)
LCSW Group Therapy Note   Group Date: 04/25/2022 Start Time: 1300 End Time: 1400   Type of Therapy and Topic:  Group Therapy: Boundaries  Participation Level:  Did Not Attend  Description of Group: This group will address the use of boundaries in their personal lives. Patients will explore why boundaries are important, the difference between healthy and unhealthy boundaries, and negative and postive outcomes of different boundaries and will look at how boundaries can be crossed.  Patients will be encouraged to identify current boundaries in their own lives and identify what kind of boundary is being set. Facilitators will guide patients in utilizing problem-solving interventions to address and correct types boundaries being used and to address when no boundary is being used. Understanding and applying boundaries will be explored and addressed for obtaining and maintaining a balanced life. Patients will be encouraged to explore ways to assertively make their boundaries and needs known to significant others in their lives, using other group members and facilitator for role play, support, and feedback.  Therapeutic Goals:  1.  Patient will identify areas in their life where setting clear boundaries could be  used to improve their life.  2.  Patient will identify signs/triggers that a boundary is not being respected. 3.  Patient will identify two ways to set boundaries in order to achieve balance in  their lives: 4.  Patient will demonstrate ability to communicate their needs and set boundaries  through discussion and/or role plays  Summary of Patient Progress:   Patient did not attend group despite encouraged participation.   Therapeutic Modalities:   Cognitive Behavioral Therapy Solution-Focused Therapy  Uldine Fuster W Jahki Witham, LCSWA 04/25/2022  2:59 PM    
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
## 2022-04-25 NOTE — Progress Notes (Signed)
Patient came to nurses station asking for this writer to check his blood sugar, he wanted to see how high it is. This Clinical research associate obtained a CBG on patient, and it was 140. Patient was satisfied with that reading and walked off going back to his room.

## 2022-04-25 NOTE — Plan of Care (Signed)
D- Patient alert and oriented. Patient presents in a preoccupied, but pleasant mood on assessment stating that he didn't sleep good last night "because they didn't give me my Abilify". This Clinical research associate tried to explain to patient that he only gets Abilify twice during the day and not at night. Patient is fixated on going to the [facility] because "I don't know what's wrong with the staff in this hospital, I think they all have a drinking problem". Patient denies SI, HI, AVH, and pain at this time stating "I ain't going to [place]". When this writer asked patient any signs/symptoms of depression/anxiety, patient stated "you ask a lot of questions and I don't even know your name. I don't know, let me get my medicine in me, then I'll let you know". Patient had no stated goals for today, he's just anxious to discharge.  A- Scheduled medications administered to patient, per MD orders. Support and encouragement provided.  Routine safety checks conducted every 15 minutes.  Patient informed to notify staff with problems or concerns.  R- No adverse drug reactions noted. Patient contracts for safety at this time. Patient compliant with medications and treatment plan. Patient receptive, calm, and cooperative. Patient interacts well with others on the unit. Patient remains safe at this time.  Problem: Education: Goal: Knowledge of General Education information will improve Description: Including pain rating scale, medication(s)/side effects and non-pharmacologic comfort measures Outcome: Progressing   Problem: Health Behavior/Discharge Planning: Goal: Ability to manage health-related needs will improve Outcome: Progressing   Problem: Clinical Measurements: Goal: Ability to maintain clinical measurements within normal limits will improve Outcome: Progressing Goal: Will remain free from infection Outcome: Progressing Goal: Diagnostic test results will improve Outcome: Progressing Goal: Respiratory  complications will improve Outcome: Progressing Goal: Cardiovascular complication will be avoided Outcome: Progressing   Problem: Activity: Goal: Risk for activity intolerance will decrease Outcome: Progressing   Problem: Nutrition: Goal: Adequate nutrition will be maintained Outcome: Progressing   Problem: Coping: Goal: Level of anxiety will decrease Outcome: Progressing   Problem: Elimination: Goal: Will not experience complications related to bowel motility Outcome: Progressing Goal: Will not experience complications related to urinary retention Outcome: Progressing   Problem: Pain Managment: Goal: General experience of comfort will improve Outcome: Progressing   Problem: Safety: Goal: Ability to remain free from injury will improve Outcome: Progressing   Problem: Skin Integrity: Goal: Risk for impaired skin integrity will decrease Outcome: Progressing   Problem: Education: Goal: Knowledge of [place] General Education information/materials will improve Outcome: Progressing Goal: Emotional status will improve Outcome: Progressing Goal: Mental status will improve Outcome: Progressing Goal: Verbalization of understanding the information provided will improve Outcome: Progressing   Problem: Activity: Goal: Interest or engagement in activities will improve Outcome: Progressing Goal: Sleeping patterns will improve Outcome: Progressing   Problem: Coping: Goal: Ability to verbalize frustrations and anger appropriately will improve Outcome: Progressing Goal: Ability to demonstrate self-control will improve Outcome: Progressing   Problem: Health Behavior/Discharge Planning: Goal: Identification of resources available to assist in meeting health care needs will improve Outcome: Progressing Goal: Compliance with treatment plan for underlying cause of condition will improve Outcome: Progressing   Problem: Physical Regulation: Goal: Ability to maintain  clinical measurements within normal limits will improve Outcome: Progressing   Problem: Safety: Goal: Periods of time without injury will increase Outcome: Progressing   Problem: Activity: Goal: Will verbalize the importance of balancing activity with adequate rest periods Outcome: Progressing   Problem: Education: Goal: Will be free of psychotic  symptoms Outcome: Progressing Goal: Knowledge of the prescribed therapeutic regimen will improve Outcome: Progressing   Problem: Coping: Goal: Coping ability will improve Outcome: Progressing Goal: Will verbalize feelings Outcome: Progressing   Problem: Health Behavior/Discharge Planning: Goal: Compliance with prescribed medication regimen will improve Outcome: Progressing   Problem: Nutritional: Goal: Ability to achieve adequate nutritional intake will improve Outcome: Progressing   Problem: Role Relationship: Goal: Ability to communicate needs accurately will improve Outcome: Progressing Goal: Ability to interact with others will improve Outcome: Progressing   Problem: Safety: Goal: Ability to redirect hostility and anger into socially appropriate behaviors will improve Outcome: Progressing Goal: Ability to remain free from injury will improve Outcome: Progressing   Problem: Self-Care: Goal: Ability to participate in self-care as condition permits will improve Outcome: Progressing   Problem: Self-Concept: Goal: Will verbalize positive feelings about self Outcome: Progressing

## 2022-04-25 NOTE — [facility] Suicide Risk Assessment (Signed)
Suicide Risk Assessment  [facility] Discharge Suicide Risk Assessment   Principal Problem: Bipolar 1 disorder [facility]) Discharge Diagnoses: Principal Problem:   Bipolar 1 disorder ([facility])   Total Time spent with patient: 45 minutes  Musculoskeletal: Strength & Muscle Tone: within normal limits Gait & Station: normal Patient leans: N/A  Psychiatric Specialty Exam: Physical Exam Vitals and nursing note reviewed.  Constitutional:      Appearance: Normal appearance.  HENT:     Head: Normocephalic.     Nose: Nose normal.  Pulmonary:     Effort: Pulmonary effort is normal.  Musculoskeletal:        General: Normal range of motion.     Cervical back: Normal range of motion.  Neurological:     General: No focal deficit present.     Mental Status: He is alert and oriented to person, place, and time.  Psychiatric:        Attention and Perception: Attention and perception normal.        Mood and Affect: Affect normal. Mood is anxious.        Speech: Speech normal.        Behavior: Behavior normal. Behavior is cooperative.        Thought Content: Thought content normal.        Cognition and Memory: Cognition and memory normal.        Judgment: Judgment normal.     Review of Systems  Psychiatric/Behavioral:  The patient is nervous/anxious.   All other systems reviewed and are negative.   Blood pressure 134/79, pulse 68, temperature 97.6 F (36.4 C), temperature source Oral, resp. rate 18, SpO2 99 %.There is no height or weight on file to calculate BMI.  General Appearance: Casual  Eye Contact:  Good  Speech:  Normal Rate  Volume:  Normal  Mood:  Anxious  Affect:  Congruent  Thought Process:  Coherent and Descriptions of Associations: Intact  Orientation:  Full (Time, Place, and Person)  Thought Content:  WDL and Logical  Suicidal Thoughts:  No  Homicidal Thoughts:  No  Memory:  Immediate;   Good Recent;   Good Remote;   Good  Judgement:  Fair  Insight:  Good  Psychomotor  Activity:  Normal  Concentration:  Concentration: Good and Attention Span: Good  Recall:  Good  Fund of Knowledge:  Fair  Language:  Good  Akathisia:  No  Handed:  Right  AIMS (if indicated):     Assets:  Leisure Time Physical Health Resilience Social Support  ADL's:  Intact  Cognition:  WNL  Sleep:        Physical Exam: Physical Exam Vitals and nursing note reviewed.  Constitutional:      Appearance: Normal appearance.  HENT:     Head: Normocephalic.     Nose: Nose normal.  Pulmonary:     Effort: Pulmonary effort is normal.  Musculoskeletal:        General: Normal range of motion.     Cervical back: Normal range of motion.  Neurological:     General: No focal deficit present.     Mental Status: He is alert and oriented to person, place, and time.  Psychiatric:        Attention and Perception: Attention and perception normal.        Mood and Affect: Affect normal. Mood is anxious.        Speech: Speech normal.        Behavior: Behavior normal. Behavior is cooperative.  Thought Content: Thought content normal.        Cognition and Memory: Cognition and memory normal.        Judgment: Judgment normal.    Review of Systems  Psychiatric/Behavioral:  The patient is nervous/anxious.   All other systems reviewed and are negative.  Blood pressure 134/79, pulse 68, temperature 97.6 F (36.4 C), temperature source Oral, resp. rate 18, SpO2 99 %. There is no height or weight on file to calculate BMI.  Mental Status Per Nursing Assessment::   On Admission:  NA  Demographic Factors:  Male and Caucasian  Loss Factors: NA  Historical Factors: NA  Risk Reduction Factors:   Living with another person, especially a relative, Positive social support, and Positive therapeutic relationship  Continued Clinical Symptoms:  Anxiety, mild  Cognitive Features That Contribute To Risk:  None    Suicide Risk:  Minimal: No identifiable suicidal ideation.  Patients  presenting with no risk factors but with morbid ruminations; may be classified as minimal risk based on the severity of the depressive symptoms   Plan Of Care/Follow-up recommendations:  Activity:  as tolerated Diet:  heart healthy diet Bipolar I disorder: Abilify 15 mg BID Follow up with [facility]   Insomnia: Trazodone 100 mg at bedtime PRN  [name], NP 04/25/2022, 8:51 AM

## 2022-04-25 NOTE — Progress Notes (Signed)
[facility] LCSW Progress Note   04/25/2022    3:45 PM  Marris Frontera   [id number]   Type of Contact and Topic:  Attempted guardian contact    CSW made multiple attempts to reach [facility], DSS Guardian, to inform him that the patient is appropriate for discharge. Left HIPAA compliant voicemail with contact information and callback request.     No calls were returned. Unable to discharge patient with out prior notification of guardian. CSW team will make additional attempts to reach guardian prior to discharge.   CSW did not attempt to reach [facility] DSS weekend/holiday line due to prior note mentioning guardian had left prior voicemail expressing concerns over patient being discharged to shelter. Further conversations to he had with guardian to inform him the patient is NOT to board at hospital during group home placement.   Situation ongoing, CSW will continue to monitor and update note as more information becomes available.    Signed:  Corky Crafts, MSW, LCSWA, LCAS 04/25/2022 3:45 PM

## 2022-04-25 NOTE — Progress Notes (Signed)
Patient has been coming back and forth to the nurses station, ever since he found out he was up for discharge today. Patient has become intrusive, going to every staff member asking them the same questions and demanding that we not bother his guardian on fourth of July. This Clinical research associate was just informed that patient has called [employer]. Patient will be placed on phone restrictions. NP will be notified.

## 2022-04-26 NOTE — BH IP Treatment Plan (Signed)
Interdisciplinary Treatment and Diagnostic Plan Update  04/26/2022 Time of Session: 8:30 AM Wayne Santiago MRN: [id number]  Principal Diagnosis: Bipolar 1 disorder (HCC)  Secondary Diagnoses: Principal Problem:   Bipolar 1 disorder (HCC)   Current Medications:  Current Facility-Administered Medications  Medication Dose Route Frequency Provider Last Rate Last Admin   acetaminophen (TYLENOL) tablet 650 mg  650 mg Oral Q6H PRN Gillermo Murdoch, NP       alum & mag hydroxide-simeth (MAALOX/MYLANTA) 200-200-20 MG/5ML suspension 30 mL  30 mL Oral Q4H PRN Gillermo Murdoch, NP       ARIPiprazole (ABILIFY) tablet 15 mg  15 mg Oral BID Clapacs, John T, MD   15 mg at 04/26/22 4166   haloperidol (HALDOL) tablet 5 mg  5 mg Oral Q6H PRN Clapacs, Jackquline Denmark, MD   5 mg at 04/26/22 0630   Or   haloperidol lactate (HALDOL) injection 5 mg  5 mg Intramuscular Q6H PRN Clapacs, Jackquline Denmark, MD       LORazepam (ATIVAN) tablet 2 mg  2 mg Oral Q4H PRN Clapacs, Jackquline Denmark, MD   2 mg at 04/21/22 1602   Or   LORazepam (ATIVAN) injection 2 mg  2 mg Intramuscular Q4H PRN Clapacs, John T, MD       magnesium hydroxide (MILK OF MAGNESIA) suspension 30 mL  30 mL Oral Daily PRN Gillermo Murdoch, NP       nicotine polacrilex (NICORETTE) gum 2 mg  2 mg Oral PRN Clapacs, Jackquline Denmark, MD   2 mg at 04/26/22 1601   traZODone (DESYREL) tablet 100 mg  100 mg Oral QHS Gillermo Murdoch, NP   100 mg at 04/23/22 2210   PTA Medications: Medications Prior to Admission  Medication Sig Dispense Refill Last Dose   benztropine (COGENTIN) 1 MG tablet Take 1 tablet (1 mg total) by mouth 2 (two) times daily. (Patient not taking: Reported on 04/19/2022) 60 tablet 0    PARoxetine (PAXIL) 10 MG tablet Take 10 mg by mouth daily. (Patient not taking: Reported on 04/19/2022)      ziprasidone (GEODON) 40 MG capsule Take 1 capsule (40 mg total) by mouth at bedtime. (Patient not taking: Reported on 04/19/2022) 30 capsule 0    [DISCONTINUED] traZODone  (DESYREL) 100 MG tablet Take 1 tablet (100 mg total) by mouth at bedtime. (Patient not taking: Reported on 04/19/2022) 30 tablet 0     Patient Stressors: Financial difficulties   Medication change or noncompliance    Patient Strengths: Average or above average intelligence  Capable of independent living  Communication skills   Treatment Modalities: Medication Management, Group therapy, Case management,  1 to 1 session with clinician, Psychoeducation, Recreational therapy.   Physician Treatment Plan for Primary Diagnosis: Bipolar 1 disorder (HCC) Long Term Goal(s): Improvement in symptoms so as ready for discharge   Short Term Goals: Compliance with prescribed medications will improve Ability to verbalize feelings will improve Ability to disclose and discuss suicidal ideas Ability to demonstrate self-control will improve  Medication Management: Evaluate patient's response, side effects, and tolerance of medication regimen.  Therapeutic Interventions: 1 to 1 sessions, Unit Group sessions and Medication administration.  Evaluation of Outcomes: Progressing  Physician Treatment Plan for Secondary Diagnosis: Principal Problem:   Bipolar 1 disorder (HCC)  Long Term Goal(s): Improvement in symptoms so as ready for discharge   Short Term Goals: Compliance with prescribed medications will improve Ability to verbalize feelings will improve Ability to disclose and discuss suicidal ideas Ability to demonstrate self-control will  improve     Medication Management: Evaluate patient's response, side effects, and tolerance of medication regimen.  Therapeutic Interventions: 1 to 1 sessions, Unit Group sessions and Medication administration.  Evaluation of Outcomes: Progressing   RN Treatment Plan for Primary Diagnosis: Bipolar 1 disorder (HCC) Long Term Goal(s): Knowledge of disease and therapeutic regimen to maintain health will improve  Short Term Goals: Ability to demonstrate  self-control, Ability to participate in decision making will improve, Ability to verbalize feelings will improve, Ability to disclose and discuss suicidal ideas, Ability to identify and develop effective coping behaviors will improve, and Compliance with prescribed medications will improve  Medication Management: RN will administer medications as ordered by provider, will assess and evaluate patient's response and provide education to patient for prescribed medication. RN will report any adverse and/or side effects to prescribing provider.  Therapeutic Interventions: 1 on 1 counseling sessions, Psychoeducation, Medication administration, Evaluate responses to treatment, Monitor vital signs and CBGs as ordered, Perform/monitor CIWA, COWS, AIMS and Fall Risk screenings as ordered, Perform wound care treatments as ordered.  Evaluation of Outcomes: Progressing   LCSW Treatment Plan for Primary Diagnosis: Bipolar 1 disorder (HCC) Long Term Goal(s): Safe transition to appropriate next level of care at discharge, Engage patient in therapeutic group addressing interpersonal concerns.  Short Term Goals: Engage patient in aftercare planning with referrals and resources, Increase social support, Increase ability to appropriately verbalize feelings, Increase emotional regulation, Facilitate acceptance of mental health diagnosis and concerns, Facilitate patient progression through stages of change regarding substance use diagnoses and concerns, and Increase skills for wellness and recovery  Therapeutic Interventions: Assess for all discharge needs, 1 to 1 time with Social worker, Explore available resources and support systems, Assess for adequacy in community support network, Educate family and significant other(s) on suicide prevention, Complete Psychosocial Assessment, Interpersonal group therapy.  Evaluation of Outcomes: Progressing   Progress in Treatment: Attending groups: No. Participating in groups:  No. Taking medication as prescribed: Yes. Toleration medication: Yes. Family/Significant other contact made: Yes, individual(s) contacted:  SPE completed with the patient's guardian. Patient understands diagnosis: Yes. Discussing patient identified problems/goals with staff: Yes. Medical problems stabilized or resolved: Yes. Denies suicidal/homicidal ideation: Yes. Issues/concerns per patient self-inventory: No. Other: none  New problem(s) identified: No, Describe:  none identified. Update 04/26/2022:  No changes at this time.   New Short Term/Long Term Goal(s): elimination of symptoms of psychosis, medication management for mood stabilization; elimination of SI thoughts; development of comprehensive mental wellness plan. Update 04/26/2022:  No changes at this time.   Patient Goals: Pt was disorganized and spoke with raised tones in an aggressive manner. "I don't know. I'll work on myself if you give me some ideas." When leaving pt began calling staff obscenities stating that they would not get him.  Update 04/26/2022:  No changes at this time.   Discharge Plan or Barriers: CSW will work with guardian to develop an appropriate aftercare/discharge plan.  Update 04/26/2022:  Patient is scheduled for discharge.  Difficulties reaching the patient's guardian has prevented patient from being discharged.  Plans are for patient to go to [facility].    Reason for Continuation of Hospitalization: Aggression Hallucinations Medication stabilization   Estimated Length of Stay: 1-7 days  Last 3 [place] Suicide Severity Risk Score: Flowsheet Row Admission (Current) from 04/19/2022 in [facility] INPATIENT BEHAVIORAL MEDICINE Most recent reading at 04/19/2022 11:00 PM ED from 04/19/2022 in [facility] EMERGENCY DEPARTMENT Most recent reading at 04/19/2022  4:28 PM  ED from 04/19/2022 in [facility] EMERGENCY DEPARTMENT Most recent reading at 04/19/2022  8:52 AM  C-SSRS  RISK CATEGORY No Risk No Risk No Risk       Last PHQ 2/9 Scores:     No data to display          Scribe for Treatment Team: Harden Mo, LCSW 04/26/2022 9:15 AM

## 2022-04-26 NOTE — [facility] Group Notes (Signed)
[facility] Group Notes:  (Nursing/MHT/Case Management/Adjunct)  Date:  04/26/2022  Time:  9:03 AM  Type of Therapy:   community meeting  Participation Level:  Minimal  Participation Quality:  Appropriate and Attentive  Affect:  Appropriate  Cognitive:  Alert  Insight:  Improving  Engagement in Group:  Engaged  Modes of Intervention:  Discussion and Support  Summary of Progress/Problems:  Wayne Santiago 04/26/2022, 9:03 AM

## 2022-04-26 NOTE — Progress Notes (Signed)
Recreation Therapy Notes  Date: 04/26/2022  Time: 10:15 am     Location: Craft room   Behavioral response: Appropriate  Intervention Topic:  Time Management    Discussion/Intervention:  Group content today was focused on time management. The group defined time management and identified healthy ways to manage time. Individuals expressed how much of the 24 hours they use in a day. Patients expressed how much time they use just for themselves personally. The group expressed how they have managed their time in the past. Individuals participated in the intervention "Managing Life" where they had a chance to see how much of the 24 hours they use and where it goes. Clinical Observations/Feedback: Patient came to group late. Individual was social with peers and staff while participating in the intervention.    Jaidy Cottam LRT/CTRS         Wayne Santiago 04/26/2022 12:13 PM

## 2022-04-26 NOTE — Progress Notes (Signed)
Client was discharged yesterday but was unable to get in touch with his guardian.  He desires to go to [facility] that he knows well and follow up with the [facility].  Wayne Santiago also has an account with the drug store there, [facility], to obtain his medications.  Staff will continue to try and reach his guardian as he continues to be cleared, stable and ready for discharge.  [name], PMHNP

## 2022-04-26 NOTE — Care Management Important Message (Signed)
Important Message  Patient Details  Name: Wayne Santiago MRN: [id number] Date of Birth: May 18, 1971   Medicare Important Message Given:  Yes  CSW called guardian regarding IM and also sent copy via e-mail.   Glenis Smoker, LCSW 04/26/2022, 11:03 AM

## 2022-04-26 NOTE — Progress Notes (Signed)
  [facility] Adult Case Management Discharge Plan :  Will you be returning to the same living situation after discharge:  No. At discharge, do you have transportation home?: Yes,  CSW made transportation arrangements. Do you have the ability to pay for your medications: Yes,  Medicare Part A and B.  Release of information consent forms completed and in the chart;  Patient's signature needed at discharge.  Patient to Follow up at:  Follow-up Information     [facility] Follow up.   Why: Per guardian, referral has been made. Contact information: [address] [place], [place] [address] Phone: [phone] Fax: [phone]        [facility] Follow up.   Why: Per guardian, referral has been made. Contact information: [address]  [address] [place], [place] [address] Office: [phone] Fax: [phone]                Next level of care provider has access to [facility] Link:no  Safety Planning and Suicide Prevention discussed: Yes,  SPE completed with guardian.     Has patient been referred to the Quitline?: Patient refused referral  Patient has been referred for addiction treatment: N/A  Glenis Smoker, LCSW 04/26/2022, 11:05 AM

## 2022-04-26 NOTE — Progress Notes (Signed)
Patient denies SI,HI and AVH. Verbalized understanding discharge instructions,prescriptions and follow up care. All belongings returned from [employer].Patient escorted out by staff & transported by cab.

## 2022-04-26 NOTE — Progress Notes (Signed)
Patient observed interacting appropriately with staff and peers on the unit. Pt refused his night medication, check MAR. Pt denies SI/HI/AVH. Pt given education, support, and encouragement to be active in his treatment plan. Pt being monitored Q 15 minutes for safety per unit protocol. Pt remains safe on the unit.

## 2022-04-26 NOTE — [facility] Suicide Risk Assessment (Signed)
[facility] INPATIENT:  Family/Significant Other Suicide Prevention Education  Suicide Prevention Education:  Education Completed; Tiburcio Bash Nicholls/guardian [facility] [phone]), has been identified by the patient as the family member/significant other with whom the patient will be residing, and identified as the person(s) who will aid the patient in the event of a mental health crisis (suicidal ideations/suicide attempt).  With written consent from the patient, the family member/significant other has been provided the following suicide prevention education, prior to the and/or following the discharge of the patient.  The suicide prevention education provided includes the following: Suicide risk factors Suicide prevention and interventions National Suicide Hotline telephone number [facility] assessment telephone number [facility] Emergency Assistance 911 [facility] and/or Residential Mobile Crisis Unit telephone number  Request made of family/significant other to: Remove weapons (e.g., guns, rifles, knives), all items previously/currently identified as safety concern.   Remove drugs/medications (over-the-counter, prescriptions, illicit drugs), all items previously/currently identified as a safety concern.  The family member/significant other verbalizes understanding of the suicide prevention education information provided.  The family member/significant other agrees to remove the items of safety concern listed above.  Glenis Smoker 04/26/2022, 11:28 AM

## 2022-04-26 NOTE — Progress Notes (Signed)
Recreation Therapy Notes  INPATIENT RECREATION TR PLAN  Patient Details Name: Wayne Santiago MRN: [id number] DOB: 08-06-1971 Today's Date: 04/26/2022  Rec Therapy Plan Is patient appropriate for Therapeutic Recreation?: Yes Treatment times per week: at least 3 Estimated Length of Stay: 5-7 days TR Treatment/Interventions: Group participation (Comment)  Discharge Criteria Pt will be discharged from therapy if:: Discharged Treatment plan/goals/alternatives discussed and agreed upon by:: Patient/family  Discharge Summary Short term goals set: Patient will engage in groups without prompting or encouragement from LRT x3 group sessions within 5 recreation therapy group sessions Short term goals met: Adequate for discharge Progress toward goals comments: Groups attended Which groups?: Other (Comment) (Time Management) Reason goals not met: N/A Therapeutic equipment acquired: N/A Reason patient discharged from therapy: Discharge from hospital Pt/family agrees with progress & goals achieved: Yes Date patient discharged from therapy: 04/26/22   Ehren Berisha 04/26/2022, 2:29 PM

## 2022-09-30 ENCOUNTER — Emergency Department
Admission: EM | Admit: 2022-09-30 | Discharge: 2022-09-30 | Disposition: A | Discharge status: Home / Self Care | Payer: Medicare Other | Attending: Emergency Medicine | Admitting: Emergency Medicine

## 2022-09-30 ENCOUNTER — Encounter: Payer: Self-pay | Admitting: Emergency Medicine

## 2022-09-30 DIAGNOSIS — Z765 Malingerer [conscious simulation]: Secondary | ICD-10-CM

## 2022-09-30 DIAGNOSIS — R4182 Altered mental status, unspecified: Secondary | ICD-10-CM | POA: Diagnosis present

## 2022-09-30 DIAGNOSIS — Z1152 Encounter for screening for COVID-19: Secondary | ICD-10-CM | POA: Insufficient documentation

## 2022-09-30 DIAGNOSIS — F319 Bipolar disorder, unspecified: Secondary | ICD-10-CM | POA: Diagnosis not present

## 2022-09-30 DIAGNOSIS — F1410 Cocaine abuse, uncomplicated: Secondary | ICD-10-CM | POA: Diagnosis present

## 2022-09-30 DIAGNOSIS — F309 Manic episode, unspecified: Secondary | ICD-10-CM | POA: Diagnosis not present

## 2022-09-30 DIAGNOSIS — Z5900 Homelessness unspecified: Secondary | ICD-10-CM | POA: Diagnosis not present

## 2022-09-30 LAB — CBC
HCT: 49.9 % (ref 39.0–52.0)
Hemoglobin: 16.9 g/dL (ref 13.0–17.0)
MCH: 29.5 pg (ref 26.0–34.0)
MCHC: 33.9 g/dL (ref 30.0–36.0)
MCV: 87.1 fL (ref 80.0–100.0)
Platelets: 263 10*3/uL (ref 150–400)
RBC: 5.73 MIL/uL (ref 4.22–5.81)
RDW: 12.2 % (ref 11.5–15.5)
WBC: 12.4 10*3/uL — ABNORMAL HIGH (ref 4.0–10.5)
nRBC: 0 % (ref 0.0–0.2)

## 2022-09-30 LAB — COMPREHENSIVE METABOLIC PANEL
ALT: 33 U/L (ref 0–44)
AST: 68 U/L — ABNORMAL HIGH (ref 15–41)
Albumin: 5 g/dL (ref 3.5–5.0)
Alkaline Phosphatase: 85 U/L (ref 38–126)
Anion gap: 7 (ref 5–15)
BUN: 12 mg/dL (ref 6–20)
CO2: 29 mmol/L (ref 22–32)
Calcium: 9.4 mg/dL (ref 8.9–10.3)
Chloride: 101 mmol/L (ref 98–111)
Creatinine, Ser: 0.72 mg/dL (ref 0.61–1.24)
GFR, Estimated: >60 mL/min (ref >60)
Glucose, Bld: 90 mg/dL (ref 70–99)
Potassium: 3.6 mmol/L (ref 3.5–5.1)
Sodium: 137 mmol/L (ref 135–145)
Total Bilirubin: 0.9 mg/dL (ref 0.3–1.2)
Total Protein: 8.4 g/dL — ABNORMAL HIGH (ref 6.5–8.1)

## 2022-09-30 LAB — ETHANOL: Alcohol, Ethyl (B): <10 mg/dL (ref <10)

## 2022-09-30 LAB — SALICYLATE LEVEL: Salicylate Lvl: <7 mg/dL — ABNORMAL LOW (ref 7.0–30.0)

## 2022-09-30 LAB — ACETAMINOPHEN LEVEL: Acetaminophen (Tylenol), Serum: <10 ug/mL — ABNORMAL LOW (ref 10–30)

## 2022-09-30 LAB — SARS CORONAVIRUS 2 BY RT PCR: SARS Coronavirus 2 by RT PCR: NEGATIVE

## 2022-09-30 MED ORDER — LORAZEPAM 2 MG PO TABS
2.0000 mg | ORAL_TABLET | Freq: Once | ORAL | Status: AC
  Administered 2022-09-30: 2 mg via ORAL
  Filled 2022-09-30: qty 1

## 2022-09-30 MED ORDER — OLANZAPINE 10 MG PO TABS
10.0000 mg | ORAL_TABLET | ORAL | Status: AC
  Administered 2022-09-30: 10 mg via ORAL
  Filled 2022-09-30: qty 1

## 2022-09-30 MED ORDER — ARIPIPRAZOLE 15 MG PO TABS
15.0000 mg | ORAL_TABLET | Freq: Two times a day (BID) | ORAL | Status: DC
  Administered 2022-09-30: 15 mg via ORAL
  Filled 2022-09-30: qty 1

## 2022-09-30 NOTE — ED Notes (Signed)
Dr. Forbach at the bedside for pt evaluation  

## 2022-09-30 NOTE — ED Provider Notes (Signed)
[facility] Provider Note    Event Date/Time   First MD Initiated Contact with Patient 09/30/22 0257     (approximate)   History   Psychiatric Evaluation   HPI Level 5 caveat:  history/ROS limited by active psychosis / mental illness / altered mental status  Wayne Santiago is a 51 y.o. male who has been seen in the past for psychiatric issues with a known history of bipolar versus schizoaffective disorders as well as drug abuse.  He reports that he is homeless and came in by EMS for unclear reasons.  He is unable to give a steady or coherent history.  He is talking about medications like Paxil, people I have never heard of, will briefly become agitated and forceful with his beginning about a variety of topics, and then will ask for remote control.  He has no specific complaints or concerns at this time that I can gather other than some concern about medications.  He is in no pain, he is eating food, and wants to watch TV.     Physical Exam   Triage Vital Signs: ED Triage Vitals  Enc Vitals Group     BP 09/30/22 0234 (!) 145/87     Pulse Rate 09/30/22 0234 70     Resp 09/30/22 0234 18     Temp 09/30/22 0234 98.5 F (36.9 C)     Temp Source 09/30/22 0234 Oral     SpO2 09/30/22 0234 98 %     Weight 09/30/22 0239 93.2 kg (205 lb 7.5 oz)     Height 09/30/22 0239 1.854 m (6\' 1" )     Head Circumference --      Peak Flow --      Pain Score --      Pain Loc --      Pain Edu? --      Excl. in GC? --     Most recent vital signs: Vitals:   09/30/22 0234  BP: (!) 145/87  Pulse: 70  Resp: 18  Temp: 98.5 F (36.9 C)  SpO2: 98%     General: Awake, no distress but agitated. CV:  Good peripheral perfusion.  Regular rate and rhythm. Resp:  Normal effort.  Abd:  No distention.  Other:  Patient has pressured speech, tangential thinking, flight of ideas, may or may not be responding to internal stimuli, somewhat labile emotions where he seems to get  angry but then settles back down almost immediately.  Concern for substance-induced mood disorder versus acute psychosis.   ED Results / Procedures / Treatments   Labs (all labs ordered are listed, but only abnormal results are displayed) Labs Reviewed  COMPREHENSIVE METABOLIC PANEL - Abnormal; Notable for the following components:      Result Value   Total Protein 8.4 (*)    AST 68 (*)    All other components within normal limits  SALICYLATE LEVEL - Abnormal; Notable for the following components:   Salicylate Lvl <7.0 (*)    All other components within normal limits  ACETAMINOPHEN LEVEL - Abnormal; Notable for the following components:   Acetaminophen (Tylenol), Serum <10 (*)    All other components within normal limits  CBC - Abnormal; Notable for the following components:   WBC 12.4 (*)    All other components within normal limits  SARS CORONAVIRUS 2 BY RT PCR  ETHANOL  URINE DRUG SCREEN, QUALITATIVE ([facility] ONLY)      PROCEDURES:  Critical Care performed: No  Procedures   MEDICATIONS ORDERED IN ED: Medications  LORazepam (ATIVAN) tablet 2 mg (2 mg Oral Given 09/30/22 0332)  OLANZapine (ZYPREXA) tablet 10 mg (10 mg Oral Given 09/30/22 0332)     IMPRESSION / MDM / ASSESSMENT AND PLAN / ED COURSE  I reviewed the triage vital signs and the nursing notes.                              Differential diagnosis includes, but is not limited to, psychosis, mania, metabolic or electrolyte abnormality, medication or drug side effect, malingering or secondary gain.  Patient's presentation is most consistent with acute presentation with potential threat to life or bodily function.  As per protocol, I ordered the following labs as part of the patient's medical and psychiatric evaluation:  CBC, CMP, ethanol level, acetaminophen level, salicylate level, urine drug screen, COVID swab.  Patient has mild leukocytosis but it is not a clear explanation for his current presentation.   Vital signs and physical exam are generally reassuring except for his seemingly unstable psychiatric state.  I reviewed his medical record including reading the discharge summary by psychiatry [name]) from 04/25/2022.  The behavior he is currently exhibiting appears consistent with his presentation back then.  He appears to have decompensated in the intervening time and needs evaluation tonight.  For his current unpredictable and somewhat concerning behavior, I ordered Ativan 2 mg p.o. and Zyprexa 10 mg p.o.  He is willing to take oral medications without any objection.  The patient has been placed in psychiatric observation due to the need to provide a safe environment for the patient while obtaining psychiatric consultation and evaluation, as well as ongoing medical and medication management to treat the patient's condition.  The patient has not been placed under full IVC at this time.  There is a question of whether or not he has a legal guardian but he has apparently been homeless for at least months if not longer without anyone specifically checking on him.       FINAL CLINICAL IMPRESSION(S) / ED DIAGNOSES   Final diagnoses:  Mania (HCC)     Rx / DC Orders   ED Discharge Orders     None        Note:  This document was prepared using Dragon voice recognition software and may include unintentional dictation errors.   Loleta Rose, MD 09/30/22 [phone]

## 2022-09-30 NOTE — ED Notes (Signed)
TTS attempted to complete assessment.  Patient refused to engage with the assessor.

## 2022-09-30 NOTE — ED Notes (Signed)
Breakfast meal tray delivered at this time. 

## 2022-09-30 NOTE — Progress Notes (Signed)
CSW received a call from Charlane Ferretti, [phone] who is the on-call worker for [facility]PS this weekend. CSW informed Ms. Raul Del of patients admission and being psych cleared. Ms. Raul Del stated she will update this information in their system and make sure his assigned guardian Norma Fredrickson is made aware.

## 2022-09-30 NOTE — ED Notes (Signed)
Pt refusing vital sign recheck at this time

## 2022-09-30 NOTE — Progress Notes (Signed)
CSW contacted the [facility] non-emergency number [phone], due to social services being closed on the weekends. CSW was told by dispatch they will tell the on-call APS worker to call CSW.

## 2022-09-30 NOTE — ED Notes (Addendum)
Pt dressed out by this RN & Jeannett Senior, RN belongings include:   1 pair of jeans 1 pair of brown shoes 1 gray jacket  1 black belt

## 2022-09-30 NOTE — ED Notes (Addendum)
This RN tried calling [employer] office to speak with legal guardian with no response. Will continue to try and make contact.

## 2022-09-30 NOTE — ED Notes (Signed)
VOL/pending psych consult 

## 2022-09-30 NOTE — ED Notes (Signed)
Pt given turkey sandwich tray and beverage  

## 2022-09-30 NOTE — ED Triage Notes (Signed)
Pt presents via EMS for "homelessness" - pt is speaking to multiple persons that are present around him. Word salad when asked direct questions - easily distracted. Pt cooperative in triage. Denies SI/HI
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
## 2022-09-30 NOTE — ED Triage Notes (Signed)
First RN Note: Pt to ED via [facility] from [facility] with c/o "homelessness". Per EMS pt requested that PD be called so that he could get "free meals for a year". Per EMS pupils noted to be a 2, VSS en route, per EMS pt repeatedly stating he doesn't feel well and would not specify anything else.

## 2022-09-30 NOTE — ED Notes (Signed)
TTS at bedside at this time.  

## 2022-09-30 NOTE — BH Assessment (Incomplete)
Comprehensive Clinical Assessment (CCA) Note  09/30/2022 Wayne Santiago [id number]  Chief Complaint:  Chief Complaint  Patient presents with   Psychiatric Evaluation   Visit Diagnosis: ***    CCA Screening, Triage and Referral (STR)  Patient Reported Information How did you hear about [place]? Legal System  Referral name: No data recorded Referral phone number: No data recorded  Whom do you see for routine medical problems? No data recorded Practice/Facility Name: No data recorded Practice/Facility Phone Number: No data recorded Name of Contact: No data recorded Contact Number: No data recorded Contact Fax Number: No data recorded Prescriber Name: No data recorded Prescriber Address (if known): No data recorded  What Is the Reason for Your Visit/Call Today? Patient presents under IVC ude to bizarre behavior  How Long Has This Been Causing You Problems? > than 6 months  What Do You Feel Would Help You the Most Today? No data recorded  Have You Recently Been in Any Inpatient Treatment (Hospital/Detox/Crisis Center/28-Day Program)? No data recorded Name/Location of Program/Hospital:No data recorded How Long Were You There? No data recorded When Were You Discharged? No data recorded  Have You Ever Received Services From [facility] Before? No data recorded Who Do You See at [facility]? No data recorded  Have You Recently Had Any Thoughts About Hurting Yourself? -- (UTA)  Are You Planning to Commit Suicide/Harm Yourself At This time? -- (UTA)   Have you Recently Had Thoughts About Hurting Someone Else? -- (UTA)  Explanation: No data recorded  Have You Used Any Alcohol or Drugs in the Past 24 Hours? No  How Long Ago Did You Use Drugs or Alcohol? No data recorded What Did You Use and How Much? No data recorded  Do You Currently Have a Therapist/Psychiatrist? -- (UTA)  Name of Therapist/Psychiatrist: No data recorded  Have You Been Recently Discharged From Any  Office Practice or Programs? -- (UTA)  Explanation of Discharge From Practice/Program: No data recorded    CCA Screening Triage Referral Assessment Type of Contact: Face-to-Face  Is this Initial or Reassessment? No data recorded Date Telepsych consult ordered in CHL:  No data recorded Time Telepsych consult ordered in CHL:  No data recorded  Patient Reported Information Reviewed? No data recorded Patient Left Without Being Seen? No data recorded Reason for Not Completing Assessment: No data recorded  Collateral Involvement: No data recorded  Does Patient Have a Court Appointed Legal Guardian? No data recorded Name and Contact of Legal Guardian: No data recorded If Minor and Not Living with Parent(s), Who has Custody? No data recorded Is CPS involved or ever been involved? Never  Is APS involved or ever been involved? Never   Patient Determined To Be At Risk for Harm To Self or Others Based on Review of Patient Reported Information or Presenting Complaint? No  Method: No data recorded Availability of Means: No data recorded Intent: No data recorded Notification Required: No data recorded Additional Information for Danger to Others Potential: No data recorded Additional Comments for Danger to Others Potential: No data recorded Are There Guns or Other Weapons in Your Home? No data recorded Types of Guns/Weapons: No data recorded Are These Weapons Safely Secured?                            No data recorded Who Could Verify You Are Able To Have These Secured: No data recorded Do You Have any Outstanding Charges, Pending Court Dates, Parole/Probation? No  data recorded Contacted To Inform of Risk of Harm To Self or Others: No data recorded  [facility] of Assessment: [facility] ED   Does Patient Present under Involuntary Commitment? Yes  IVC Papers Initial File Date: 04/19/22   [place] of Residence: [place]   Patient Currently Receiving the Following Services: No data  recorded  Determination of Need: Emergent (2 hours)   Options For Referral: No data recorded    CCA Biopsychosocial Intake/Chief Complaint:  No data recorded Current Symptoms/Problems: No data recorded  Patient Reported Schizophrenia/Schizoaffective Diagnosis in Past: -- (Unknown)   Strengths: Patient is able to communicate  Preferences: No data recorded Abilities: No data recorded  Type of Services Patient Feels are Needed: No data recorded  Initial Clinical Notes/Concerns: No data recorded  Mental Health Symptoms Depression:   Sleep (too much or little)   Duration of Depressive symptoms:  Greater than two weeks   Mania:   Change in energy/activity; Increased Energy; Irritability; Racing thoughts; Recklessness   Anxiety:    Difficulty concentrating; Irritability; Restlessness   Psychosis:   Grossly disorganized speech   Duration of Psychotic symptoms:  Greater than six months   Trauma:   -- (UTA)   Obsessions:   -- (UTA)   Compulsions:   Absent insight/delusional; Poor Insight   Inattention:   None   Hyperactivity/Impulsivity:   None   Oppositional/Defiant Behaviors:   None   Emotional Irregularity:   None   Other Mood/Personality Symptoms:  No data recorded   Mental Status Exam Appearance and self-care  Stature:   Average   Weight:   Overweight   Clothing:   Disheveled; Dirty   Grooming:   Neglected   Cosmetic use:   None   Posture/gait:   Normal   Motor activity:   Restless   Sensorium  Attention:   Confused   Concentration:   Anxiety interferes; Scattered   Orientation:   Person; Place   Recall/memory:   Defective in Short-term   Affect and Mood  Affect:   Anxious; Labile   Mood:   Anxious; Irritable   Relating  Eye contact:   Avoided   Facial expression:   Responsive   Attitude toward examiner:   Cooperative   Thought and Language  Speech flow:  Flight of Ideas; Garbled; Pressured    Thought content:   Suspicious   Preoccupation:   Ruminations   Hallucinations:   -- (UTA)   Organization:  No data recorded  Company secretary of Knowledge:   Poor   Intelligence:   Average   Abstraction:   Functional   Judgement:   Poor   Reality Testing:   Distorted   Insight:   Lacking   Decision Making:   Impulsive   Social Functioning  Social Maturity:   Irresponsible   Social Judgement:   "Chief of Staff"   Stress  Stressors:   Housing; Office manager Ability:   Contractor Deficits:   None   Supports:   Support needed     Religion:    Leisure/Recreation:    Exercise/Diet:     CCA Employment/Education Employment/Work Situation:    Education:     CCA Family/Childhood History Family and Relationship History:    Childhood History:     Child/Adolescent Assessment:     CCA Substance Use Alcohol/Drug Use:  ASAM's:  Six Dimensions of Multidimensional Assessment  Dimension 1:  Acute Intoxication and/or Withdrawal Potential:      Dimension 2:  Biomedical Conditions and Complications:      Dimension 3:  Emotional, Behavioral, or Cognitive Conditions and Complications:     Dimension 4:  Readiness to Change:     Dimension 5:  Relapse, Continued use, or Continued Problem Potential:     Dimension 6:  Recovery/Living Environment:     ASAM Severity Score:    ASAM Recommended Level of Treatment:     Substance use Disorder (SUD)    Recommendations for Services/Supports/Treatments:    DSM5 Diagnoses: Patient Active Problem List   Diagnosis Date Noted   Bipolar 1 disorder (HCC) 04/19/2022   Cocaine use disorder, severe, dependence (HCC) 04/19/2015   Tobacco use disorder, severe, dependence 04/19/2015   Cocaine abuse (HCC) 04/14/2015    Patient Centered Plan: Patient is on the following Treatment Plan(s):  {CHL AMB BH OP Treatment Plans:[id number]}   Referrals  to Alternative Service(s): Referred to Alternative Service(s):   Place:   Date:   Time:    Referred to Alternative Service(s):   Place:   Date:   Time:    Referred to Alternative Service(s):   Place:   Date:   Time:    Referred to Alternative Service(s):   Place:   Date:   Time:      @BHCOLLABOFCARE @  , Counselor

## 2022-09-30 NOTE — ED Notes (Addendum)
Lunch meal tray given at this time.  

## 2022-09-30 NOTE — ED Notes (Addendum)
Wrong chart

## 2022-09-30 NOTE — ED Notes (Addendum)
This RN tried calling legal gaurdian at [facility] with no response. Will try again

## 2022-09-30 NOTE — ED Provider Notes (Signed)
Procedures     ----------------------------------------- 1:12 PM on 09/30/2022 ----------------------------------------- Case discussed with psychiatry who feels patient's presentation is due to malingering, no acute psychiatric needs.  Suitable for discharge.     Sharman Cheek, MD 09/30/22 1313

## 2022-09-30 NOTE — BH Assessment (Signed)
This writer attempted to assess patient but patient came in agitated per security and is now currently sleeping. TTS to follow-up when patient is more appropriate to be assessed

## 2022-10-01 DIAGNOSIS — M79672 Pain in left foot: Secondary | ICD-10-CM | POA: Diagnosis not present

## 2022-10-01 DIAGNOSIS — M79671 Pain in right foot: Secondary | ICD-10-CM | POA: Diagnosis not present

## 2022-10-01 DIAGNOSIS — Z5900 Homelessness unspecified: Secondary | ICD-10-CM | POA: Diagnosis not present

## 2022-10-02 ENCOUNTER — Emergency Department
Admission: EM | Admit: 2022-10-02 | Discharge: 2022-10-02 | Disposition: A | Discharge status: Home / Self Care | Payer: Medicare Other | Attending: Emergency Medicine | Admitting: Emergency Medicine

## 2022-10-02 ENCOUNTER — Other Ambulatory Visit: Payer: Self-pay

## 2022-10-02 DIAGNOSIS — M79671 Pain in right foot: Secondary | ICD-10-CM

## 2022-10-02 DIAGNOSIS — Z5900 Homelessness unspecified: Secondary | ICD-10-CM

## 2022-10-02 NOTE — ED Triage Notes (Signed)
Patient reports he was walking all day and reports bilateral foot pain. Denies injury. States that it is cold and rainy and that he was trying to get a hold of his legal guardian but was unable to reach him. States that he had law enforcement drop him off. AOX4. Calm and cooperative during triage.

## 2022-10-02 NOTE — ED Provider Notes (Signed)
   [facility] Provider Note    None    (approximate)   History   Foot Pain   HPI  Wayne Santiago is a 51 y.o. male who presents to the ED for evaluation of Foot Pain   Patient presents to the ED for evaluation of bilateral plantar foot pain after walking all day.  He reports he is homeless and was running outside and his feet been hurting.  He is staying at a [facility].  No fevers, trauma or discrete injuries.  We provide him with clean and dry clothes including dry socks.   Physical Exam   Triage Vital Signs: ED Triage Vitals  Enc Vitals Group     BP 10/01/22 2354 139/86     Pulse Rate 10/01/22 2354 72     Resp 10/01/22 2354 20     Temp 10/01/22 2354 97.7 F (36.5 C)     Temp Source 10/01/22 2354 Oral     SpO2 10/01/22 2354 99 %     Weight 10/01/22 2355 200 lb (90.7 kg)     Height 10/01/22 2355 6\' 1"  (1.854 m)     Head Circumference --      Peak Flow --      Pain Score 10/02/22 0001 8     Pain Loc --      Pain Edu? --      Excl. in GC? --     Most recent vital signs: Vitals:   10/01/22 2354  BP: 139/86  Pulse: 72  Resp: 20  Temp: 97.7 F (36.5 C)  SpO2: 99%    General: Awake, no distress.  Ambulatory CV:  Good peripheral perfusion.  Resp:  Normal effort.  Abd:  No distention.  MSK:  No deformity noted.  Calluses to bilateral plantar feet without signs of superimposed infection, trauma, swelling or injury. Neuro:  No focal deficits appreciated. Other:     ED Results / Procedures / Treatments   Labs (all labs ordered are listed, but only abnormal results are displayed) Labs Reviewed - No data to display  EKG   RADIOLOGY   Official radiology report(s): No results found.  PROCEDURES and INTERVENTIONS:  Procedures  Medications - No data to display   IMPRESSION / MDM / ASSESSMENT AND PLAN / ED COURSE  I reviewed the triage vital signs and the nursing notes.  Differential diagnosis includes, but is not  limited to, trench foot, malingering, fracture, plantar fasciitis  Homeless gentleman presents with bilateral foot pain after walking all day in the rain.  We provide dry clothing and his exam is reassuring.  Will provide food and warm blankets.  I told him he can stay in the lobby until morning if he wants     FINAL CLINICAL IMPRESSION(S) / ED DIAGNOSES   Final diagnoses:  Bilateral foot pain  Homeless     Rx / DC Orders   ED Discharge Orders     None        Note:  This document was prepared using Dragon voice recognition software and may include unintentional dictation errors.   14/10/23, MD 10/02/22 [phone]

## 2022-10-02 NOTE — ED Notes (Signed)
This RN spoke with Wayne Santiago, [facility] DSS, states patient is homeless, requests if possible that patient receive a cab voucher to a homeless shelter and she will forward patient's case to primary case worker in the morning. States okay to discharge patient to homeless shelter, okay if patient leave prior to going to homeless shelter since patient is homeless.

## 2022-10-02 NOTE — ED Notes (Signed)
Voicemail left with Legal Guardian on file.

## 2022-10-02 NOTE — ED Notes (Signed)
[phone] called and requested on call social work to call back as [facility] [employer] is patients legal guardian

## 2022-10-05 ENCOUNTER — Emergency Department
Admission: EM | Admit: 2022-10-05 | Discharge: 2022-10-06 | Disposition: A | Discharge status: Home / Self Care | Payer: Medicare Other | Attending: Emergency Medicine | Admitting: Emergency Medicine

## 2022-10-05 ENCOUNTER — Other Ambulatory Visit: Payer: Self-pay

## 2022-10-05 DIAGNOSIS — R4182 Altered mental status, unspecified: Secondary | ICD-10-CM | POA: Diagnosis not present

## 2022-10-05 DIAGNOSIS — Z046 Encounter for general psychiatric examination, requested by authority: Secondary | ICD-10-CM | POA: Diagnosis present

## 2022-10-05 DIAGNOSIS — F319 Bipolar disorder, unspecified: Secondary | ICD-10-CM | POA: Diagnosis not present

## 2022-10-05 DIAGNOSIS — F29 Unspecified psychosis not due to a substance or known physiological condition: Secondary | ICD-10-CM | POA: Insufficient documentation

## 2022-10-05 DIAGNOSIS — F4320 Adjustment disorder, unspecified: Secondary | ICD-10-CM | POA: Diagnosis not present

## 2022-10-05 NOTE — ED Triage Notes (Addendum)
Pt brought in by [facility] PD. Pt states he's here for "lithium medication" and wants to speak to a psychiatrist. Pt denies SI but endorses HI. Pt denies auditory/visual hallucinations. Denies CP, SOB. Pt not cooperative with taking vitals or blood work. Pt not making sense in triage, pt rambling.

## 2022-10-05 NOTE — ED Provider Notes (Signed)
[facility] Provider Note    Event Date/Time   First MD Initiated Contact with Patient 10/05/22 2024     (approximate)  History   Chief Complaint: Psychiatric Evaluation  HPI  Wayne Santiago is a 51 y.o. male with a past medical history bipolar, PTSD, hyperlipidemia, presents to the emergency department by [facility] PD.  Patient states to the nurse that he is here for his lithium medication per nurse denies SI but admitted to HI but did not go into any details.  Per nurse denies any chest pain or shortness of breath.  Patient was unwilling to have vitals checked her blood work performed.  When I saw the patient he was sitting on the bed picking at his toenails.  I attempted to engage the patient in conversation over approximately 3 minutes or so without the patient responding to me.  When I finally told the patient that he needed to tell me why he is here so we can help him he briefly replied I need lithium.  When I asked if he is out of this medication or if he takes it chronically he would not respond.  Patient appears altered.  We will place the patient under an IVC until the patient is able to provide any additional details of why he is here and so that psychiatry can speak with in more depth regarding his reported HI.  Physical Exam   Triage Vital Signs: ED Triage Vitals  Enc Vitals Group     BP 10/05/22 2042 111/71     Pulse Rate 10/05/22 2042 65     Resp 10/05/22 2042 16     Temp 10/05/22 2042 98.5 F (36.9 C)     Temp Source 10/05/22 2042 Oral     SpO2 10/05/22 2042 95 %     Weight --      Height --      Head Circumference --      Peak Flow --      Pain Score 10/05/22 1910 0     Pain Loc --      Pain Edu? --      Excl. in GC? --     Most recent vital signs: Vitals:   10/05/22 2042  BP: 111/71  Pulse: 65  Resp: 16  Temp: 98.5 F (36.9 C)  SpO2: 95%    General: Awake, no distress.  Patient refuses to speak to me, acts very agitated at  times. CV:  Good peripheral perfusion.  Resp:  Normal effort.   Abd:  No distention.   ED Results / Procedures / Treatments   MEDICATIONS ORDERED IN ED: Medications - No data to display   IMPRESSION / MDM / ASSESSMENT AND PLAN / ED COURSE  I reviewed the triage vital signs and the nursing notes.  Patient's presentation is most consistent with acute presentation with potential threat to life or bodily function.  Patient presents to the emergency department unclear why the patient is here.  He did state at 1 point that he is here for his lithium but would not go into any details about whether he takes this medication normally if he ran out of this medication or if he is trying to get back on this medication, etc.  Patient asked very agitated at times but is unwilling to speak to me.  Per triage nurse he did mention homicidal ideality but did not going to any other details.  Given the patient's altered state and possible HI  we will place the patient under an IVC until the patient is cleared by psychiatry.  Patient is unwilling to check labs I would like to check labs including a lithium level.  We will see if the patient calms down and would be more willing to allow [place] to check labs in an hour or so.  FINAL CLINICAL IMPRESSION(S) / ED DIAGNOSES   Altered mental status    Note:  This document was prepared using Dragon voice recognition software and may include unintentional dictation errors.   Minna Antis, MD 10/05/22 2055

## 2022-10-05 NOTE — ED Notes (Signed)
Pt dressed out into burgundy scrubs. Pt belongings placed in belongings bag  [employer] Black socks Green jacket [employer] Black belt

## 2022-10-05 NOTE — BH Assessment (Signed)
This Clinical research associate spoke with [employer] Doctor, hospital Guardian) [phone] of [facility] [employer] office, notifying him of pt's arrival.

## 2022-10-05 NOTE — ED Notes (Signed)
Pt refuses blood work, stating that it will steal his soul. Pt is educated on criteria for food. Pt states that vitals can be obtained and has to be educated after repeatedly diagreeing with requirement for accurate collection. Vitals obtained. Pt then is informed again that food will come after collecting blood work. Pt asks if he will be here through the night, discharged in the AM, for 1 week or for 1 month. This nurse educated pt that this is unknown until assessment is completed and he states that blood cannot be drawn because the nurse does not know answers. Pt left at this time. Dr.Paduchowski notified of events, states that rules for pt still apply, and that pt does not require to be held manually for blood collection.

## 2022-10-05 NOTE — BH Assessment (Addendum)
This writer attempted to assess pt; however pt refused to participate. Psych team to follow up.

## 2022-10-05 NOTE — BH Assessment (Signed)
This Clinical research associate attempted to contact [employer] Doctor, hospital Guardian) [phone] of [facility] [employer] office, with no response. A HIPAA compliant voicemail was left requesting a call back.

## 2022-10-05 NOTE — ED Notes (Signed)
Pt moved to room 23. Pt is messing with his toes sitting on side of bed. Pt will not talk to this nurse or Dr. Lenard Lance. He only requests for dinner and after educated on needs and needing information on what is going on before we can feed him before he eats he states "I need my lithium tablet." Pt does not talk or look at staff after this. Dr. Lenard Lance states he will IVC pt and that blood needs to be obtained before food.

## 2022-10-06 DIAGNOSIS — F319 Bipolar disorder, unspecified: Secondary | ICD-10-CM | POA: Diagnosis not present

## 2022-10-06 MED ORDER — TRAZODONE HCL 100 MG PO TABS: 100.0000 mg | ORAL_TABLET | Freq: Every day | ORAL | Status: DC

## 2022-10-06 MED ORDER — ARIPIPRAZOLE 15 MG PO TABS
15.0000 mg | ORAL_TABLET | Freq: Two times a day (BID) | ORAL | Status: DC
  Filled 2022-10-06: qty 1

## 2022-10-06 NOTE — ED Provider Notes (Signed)
Emergency Medicine Observation Re-evaluation Note  Wayne Santiago is a 51 y.o. male, seen on rounds today.  Pt initially presented to the ED for complaints of Psychiatric Evaluation  Currently, the patient is is no acute distress. Denies any concerns at this time.  Physical Exam  Blood pressure 111/71, pulse 65, temperature 98.5 F (36.9 C), temperature source Oral, resp. rate 16, SpO2 95 %.  Physical Exam: General: No apparent distress Pulm: Normal WOB Neuro: Moving all extremities Psych: Resting comfortably     ED Course / MDM     I have reviewed the labs performed to date as well as medications administered while in observation.  Recent changes in the last 24 hours include: No acute events overnight.  Plan   Current plan: Patient awaiting psychiatric disposition. Patient is under full IVC at this time.   Labs, urine, COVID swab and psychiatric consultation pending as patient previously refusing to cooperate or answer any questions.   Alger Kerstein, Layla Maw, DO 10/06/22 [phone]

## 2022-10-06 NOTE — ED Notes (Signed)
Belongings returned to pt, pt states that these are all of his things,pt dressed, pt verbalized understanding d/c instructions and follow up, states that he is taking a bus to a shelter. Pt continues to refuse vital signs, pt ambulatory from dpt.

## 2022-10-06 NOTE — ED Notes (Signed)
Pt at the door asking for breakfast tray. Informed pt that tray would take a few minutes to get to the ED. Cussing and threatening the officer.

## 2022-10-06 NOTE — ED Notes (Addendum)
Pt out of room and walked to nursing desk. Pt asking another staff member questions that she was unable to answer and ignoring officer when he attempted to redirect the pt stating "Shut up!" To the officer. Pt requesting a remote and drink at this time. Pt back in his room at this time.

## 2022-10-06 NOTE — ED Provider Notes (Signed)
Patient was seen by psychiatry Dr. Toni Amend who is very familiar with the patient did not feel like blood work need to be done and has rescinded the IVC   Concha Se, MD 10/06/22 1134

## 2022-10-06 NOTE — BH Assessment (Signed)
Comprehensive Clinical Assessment (CCA) Note  10/06/2022 [name] [id number]  Chief Complaint:  Chief Complaint  Patient presents with   Psychiatric Evaluation   Visit Diagnosis: Adjustment disorder   Wayne Santiago is a 51 year old male who presents to the ER because he didn't meet the deadline for to get into the shelter. While in the ER, patient was refusing lab work, medications but requesting lithium and graham crackers. During the interview the patient denied SI/HI and AV/H.  CCA Screening, Triage and Referral (STR)  Patient Reported Information How did you hear about [place]? Self  What Is the Reason for Your Visit/Call Today? Came to the ER because he didn't make it to the shelter before the deadline.  How Long Has This Been Causing You Problems? 1 wk - 1 month  What Do You Feel Would Help You the Most Today? Treatment for Depression or other mood problem  Have You Recently Had Any Thoughts About Hurting Yourself? No  Are You Planning to Commit Suicide/Harm Yourself At This time? No   Flowsheet Row ED from 10/05/2022 in [facility] ED from 10/02/2022 in [facility] EMERGENCY DEPARTMENT ED from 09/30/2022 in [facility]  C-SSRS RISK CATEGORY No Risk No Risk No Risk       Have you Recently Had Thoughts About Hurting Someone Karolee Ohs? No  Are You Planning to Harm Someone at This Time? No  Explanation: No data recorded  Have You Used Any Alcohol or Drugs in the Past 24 Hours? No  What Did You Use and How Much? No data recorded  Do You Currently Have a Therapist/Psychiatrist? -- (UTA)  Name of Therapist/Psychiatrist:    Have You Been Recently Discharged From Any Office Practice or Programs? -- (UTA)  Explanation of Discharge From Practice/Program: No data recorded    CCA Screening Triage Referral Assessment Type of Contact: Face-to-Face  Telemedicine  Service Delivery:   Is this Initial or Reassessment?   Date Telepsych consult ordered in [facility]:    Time Telepsych consult ordered in [facility]:    Location of Assessment: [facility] ED  Provider Location: [facility] ED   Collateral Involvement: No data recorded  Does Patient Have a Court Appointed Legal Guardian? Yes Other:  Legal Guardian Contact Information: [facility] DSS  Copy of Legal Guardianship Form: Yes  Legal Guardian Notified of Arrival: Successfully notified  Legal Guardian Notified of Pending Discharge: Successfully notified  If Minor and Not Living with Parent(s), Who has Custody? No data recorded Is CPS involved or ever been involved? Never  Is APS involved or ever been involved? Never   Patient Determined To Be At Risk for Harm To Self or Others Based on Review of Patient Reported Information or Presenting Complaint? No  Method: No data recorded Availability of Means: No data recorded Intent: No data recorded Notification Required: No data recorded Additional Information for Danger to Others Potential: No data recorded Additional Comments for Danger to Others Potential: No data recorded Are There Guns or Other Weapons in Your Home? No data recorded Types of Guns/Weapons: No data recorded Are These Weapons Safely Secured?                            No data recorded Who Could Verify You Are Able To Have These Secured: No data recorded Do You Have any Outstanding Charges, Pending Court Dates, Parole/Probation? No data recorded Contacted To Inform  of Risk of Harm To Self or Others: No data recorded   Does Patient Present under Involuntary Commitment? Yes   [place] of Residence: [place]   Patient Currently Receiving the Following Services: No data recorded  Determination of Need: Emergent (2 hours)   Options For Referral: No data recorded   CCA Biopsychosocial Patient Reported Schizophrenia/Schizoaffective Diagnosis in Past: -- (Unknown)   Strengths: Patient is  able to communicate   Mental Health Symptoms Depression:   Sleep (too much or little)   Duration of Depressive symptoms:    Mania:   Change in energy/activity; Increased Energy; Irritability; Racing thoughts; Recklessness   Anxiety:    Difficulty concentrating; Irritability; Restlessness   Psychosis:   Grossly disorganized speech   Duration of Psychotic symptoms:    Trauma:   -- (UTA)   Obsessions:   -- (UTA)   Compulsions:   Absent insight/delusional; Poor Insight   Inattention:   None   Hyperactivity/Impulsivity:   None   Oppositional/Defiant Behaviors:   None   Emotional Irregularity:   None   Other Mood/Personality Symptoms:  No data recorded   Mental Status Exam Appearance and self-care  Stature:   Average   Weight:   Overweight   Clothing:   Disheveled; Dirty   Grooming:   Neglected   Cosmetic use:   None   Posture/gait:   Normal   Motor activity:   Restless   Sensorium  Attention:   Confused   Concentration:   Anxiety interferes; Scattered   Orientation:   Person; Place   Recall/memory:   Defective in Short-term   Affect and Mood  Affect:   Anxious; Labile   Mood:   Anxious; Irritable   Relating  Eye contact:   Avoided   Facial expression:   Responsive   Attitude toward examiner:   Cooperative   Thought and Language  Speech flow:  Flight of Ideas; Garbled; Pressured   Thought content:   Suspicious   Preoccupation:   Ruminations   Hallucinations:   -- (UTA)   Organization:   Disorganized; Insurance underwriter of Knowledge:   Poor   Intelligence:   Average   Abstraction:   Functional   Judgement:   Poor   Reality Testing:   Distorted   Insight:   Lacking   Decision Making:   Impulsive   Social Functioning  Social Maturity:   Irresponsible   Social Judgement:   "Chief of Staff"   Stress  Stressors:   Housing; Office manager Ability:   Advertising account executive Deficits:   None   Supports:   Support needed     Religion:    Leisure/Recreation:    Exercise/Diet:     CCA Employment/Education Employment/Work Situation:    Education:     CCA Family/Childhood History Family and Relationship History:    Childhood History:     CCA Substance Use Alcohol/Drug Use:     ASAM's:  Six Dimensions of Multidimensional Assessment  Dimension 1:  Acute Intoxication and/or Withdrawal Potential:      Dimension 2:  Biomedical Conditions and Complications:      Dimension 3:  Emotional, Behavioral, or Cognitive Conditions and Complications:     Dimension 4:  Readiness to Change:     Dimension 5:  Relapse, Continued use, or Continued Problem Potential:     Dimension 6:  Recovery/Living Environment:     ASAM Severity Score:    ASAM Recommended Level of Treatment:  Substance use Disorder (SUD)    Recommendations for Services/Supports/Treatments:    Discharge Disposition:    DSM5 Diagnoses: Patient Active Problem List   Diagnosis Date Noted   Homelessness 09/30/2022   Bipolar 1 disorder (HCC) 04/19/2022   Cocaine use disorder, severe, dependence (HCC) 04/19/2015   Tobacco use disorder, severe, dependence 04/19/2015   Cocaine abuse (HCC) 04/14/2015    Referrals to Alternative Service(s): Referred to Alternative Service(s):   Place:   Date:   Time:    Referred to Alternative Service(s):   Place:   Date:   Time:    Referred to Alternative Service(s):   Place:   Date:   Time:    Referred to Alternative Service(s):   Place:   Date:   Time:     Lilyan Gilford MS, LCAS, [facility], [facility] Therapeutic Triage Specialist 10/06/2022 12:14 PM

## 2022-10-06 NOTE — ED Notes (Signed)
Meal tray given, pt continues to refuse blood draw or vital signs, pt agitated; however, didn't swear at nurse this time.

## 2022-10-06 NOTE — ED Notes (Signed)
ivc rescinded by MD clapacs

## 2022-10-06 NOTE — ED Notes (Signed)
Pt given graham crackers, peanut butter and chocolate milk.

## 2022-10-06 NOTE — ED Notes (Signed)
Psych has attempted to assess pt x2 and he refuses to participate or speak both times

## 2022-10-06 NOTE — ED Notes (Signed)
Called down to dietary for breakfast tray that was not delivered. Dietary stated that a diet order needed to be placed. Notified Hydrologist
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
## 2022-10-06 NOTE — ED Notes (Signed)
98   6           6   

## 2022-10-06 NOTE — Discharge Instructions (Signed)
Patient cleared by psychiatry for discharge home

## 2022-10-06 NOTE — ED Notes (Signed)
Attempted to call pt's guardian at [phone], no answer

## 2022-10-06 NOTE — ED Notes (Signed)
Psyc at bedside

## 2022-10-06 NOTE — ED Notes (Signed)
Called dietary for meal tray

## 2022-10-06 NOTE — ED Notes (Signed)
Called pt's guardian, Norma Fredrickson at [phone], informed him that pt was seen and treated in the er, informed him that he was being d/c and that pt's plan was to take the bus to a shelter, verbalized understanding, states no further questions.

## 2022-10-06 NOTE — ED Notes (Signed)
Pt in bed, resps even and unlabored, pt agitated upon arousal, pt refused ability, pt then states "get the fuck out of my room". Asked if he would allow blood draw or vital signs, pt refused and continues to swear at staff, security and pd at bedside, pt remains agitated, pt requests food, md notified, diet order placed.

## 2022-10-06 NOTE — BH Assessment (Signed)
This Clinical research associate attempted to assess pt, alongside NP Annice Pih T.; pt continued to refuse to cooperate with the assessment.

## 2022-10-18 ENCOUNTER — Emergency Department
Admission: EM | Admit: 2022-10-18 | Discharge: 2022-10-18 | Discharge status: Against Medical Advice | Payer: Medicare Other | Attending: Emergency Medicine | Admitting: Emergency Medicine

## 2022-10-18 ENCOUNTER — Other Ambulatory Visit: Payer: Self-pay

## 2022-10-18 DIAGNOSIS — Z5321 Procedure and treatment not carried out due to patient leaving prior to being seen by health care provider: Secondary | ICD-10-CM | POA: Diagnosis not present

## 2022-10-18 DIAGNOSIS — R208 Other disturbances of skin sensation: Secondary | ICD-10-CM | POA: Diagnosis not present

## 2022-10-18 DIAGNOSIS — Z5900 Homelessness unspecified: Secondary | ICD-10-CM | POA: Diagnosis not present

## 2022-10-18 DIAGNOSIS — M79673 Pain in unspecified foot: Secondary | ICD-10-CM | POA: Diagnosis not present

## 2022-10-18 NOTE — ED Triage Notes (Signed)
Pt states he is homeless walked here, states feet are cold and wet. States pain is when he walks.

## 2022-10-18 NOTE — ED Notes (Signed)
Pt called for triage x2, stated "I am not ready to go back yet".

## 2022-10-18 NOTE — ED Provider Triage Note (Signed)
Emergency Medicine Provider Triage Evaluation Note  Wayne Santiago , a 51 y.o. male  was evaluated in triage.  Pt complains of homelessness, complains of feet cold and wet while walking.  Review of Systems  Positive: Cold and painful feet Negative: Chest pain/shortness of breath  Physical Exam  BP 134/77 (BP Location: Left Arm)   Pulse 82   Temp 98.8 F (37.1 C) (Oral)   Resp 18   Ht 6' (1.829 m)   Wt 86.2 kg   SpO2 98%   BMI 25.77 kg/m  Gen:   Awake, no distress   Resp:  Normal effort  MSK:   Moves extremities without difficulty  Other:  No extremity pallor  Medical Decision Making  Medically screening exam initiated at 3:57 AM.  Appropriate orders placed.  Wayne Santiago was informed that the remainder of the evaluation will be completed by another provider, this initial triage assessment does not replace that evaluation, and the importance of remaining in the ED until their evaluation is complete.  51 year old homeless male complaining of cold and wet feet while walking. Blanket comfort measures provided while patient awaits treatment room.   Irean Hong, MD 10/18/22 [phone]
